# Patient Record
Sex: Male | Born: 1955 | Race: Black or African American | Hispanic: No | Marital: Single | Smoking: Former smoker
Health system: Southern US, Community
[De-identification: ages and names within clinical notes are randomized; demographics above are authoritative.]

## PROBLEM LIST (undated history)

## (undated) DIAGNOSIS — I639 Cerebral infarction, unspecified: Secondary | ICD-10-CM

## (undated) DIAGNOSIS — I10 Essential (primary) hypertension: Secondary | ICD-10-CM

## (undated) DIAGNOSIS — R4701 Aphasia: Secondary | ICD-10-CM

## (undated) DIAGNOSIS — R531 Weakness: Secondary | ICD-10-CM

## (undated) DIAGNOSIS — K117 Disturbances of salivary secretion: Secondary | ICD-10-CM

## (undated) DIAGNOSIS — N183 Chronic kidney disease, stage 3 unspecified: Secondary | ICD-10-CM

## (undated) DIAGNOSIS — F32A Depression, unspecified: Secondary | ICD-10-CM

## (undated) DIAGNOSIS — R569 Unspecified convulsions: Secondary | ICD-10-CM

## (undated) DIAGNOSIS — E785 Hyperlipidemia, unspecified: Secondary | ICD-10-CM

---

## 2013-07-06 DIAGNOSIS — I10 Essential (primary) hypertension: Secondary | ICD-10-CM | POA: Insufficient documentation

## 2013-11-07 DIAGNOSIS — I619 Nontraumatic intracerebral hemorrhage, unspecified: Secondary | ICD-10-CM | POA: Insufficient documentation

## 2014-05-19 DIAGNOSIS — I429 Cardiomyopathy, unspecified: Secondary | ICD-10-CM | POA: Insufficient documentation

## 2015-06-28 DIAGNOSIS — N1831 Chronic kidney disease, stage 3a: Secondary | ICD-10-CM | POA: Insufficient documentation

## 2015-06-28 DIAGNOSIS — N183 Chronic kidney disease, stage 3 unspecified: Secondary | ICD-10-CM | POA: Insufficient documentation

## 2016-05-29 DIAGNOSIS — F1021 Alcohol dependence, in remission: Secondary | ICD-10-CM | POA: Insufficient documentation

## 2017-06-03 DIAGNOSIS — M159 Polyosteoarthritis, unspecified: Secondary | ICD-10-CM | POA: Insufficient documentation

## 2018-04-05 ENCOUNTER — Emergency Department: Payer: Medicaid Other

## 2018-04-05 ENCOUNTER — Emergency Department
Admission: EM | Admit: 2018-04-05 | Discharge: 2018-04-06 | Disposition: A | Discharge status: Home / Self Care | Payer: Medicaid Other | Attending: Emergency Medicine | Admitting: Emergency Medicine

## 2018-04-05 ENCOUNTER — Other Ambulatory Visit: Payer: Self-pay

## 2018-04-05 ENCOUNTER — Encounter: Payer: Self-pay | Admitting: Emergency Medicine

## 2018-04-05 DIAGNOSIS — Z23 Encounter for immunization: Secondary | ICD-10-CM | POA: Insufficient documentation

## 2018-04-05 DIAGNOSIS — Y9301 Activity, walking, marching and hiking: Secondary | ICD-10-CM | POA: Diagnosis not present

## 2018-04-05 DIAGNOSIS — R270 Ataxia, unspecified: Secondary | ICD-10-CM | POA: Insufficient documentation

## 2018-04-05 DIAGNOSIS — Z7902 Long term (current) use of antithrombotics/antiplatelets: Secondary | ICD-10-CM | POA: Diagnosis not present

## 2018-04-05 DIAGNOSIS — I10 Essential (primary) hypertension: Secondary | ICD-10-CM | POA: Diagnosis not present

## 2018-04-05 DIAGNOSIS — Y998 Other external cause status: Secondary | ICD-10-CM | POA: Diagnosis not present

## 2018-04-05 DIAGNOSIS — F10920 Alcohol use, unspecified with intoxication, uncomplicated: Secondary | ICD-10-CM | POA: Insufficient documentation

## 2018-04-05 DIAGNOSIS — S0993XA Unspecified injury of face, initial encounter: Secondary | ICD-10-CM | POA: Diagnosis present

## 2018-04-05 DIAGNOSIS — S0181XA Laceration without foreign body of other part of head, initial encounter: Secondary | ICD-10-CM | POA: Diagnosis not present

## 2018-04-05 DIAGNOSIS — Z79899 Other long term (current) drug therapy: Secondary | ICD-10-CM | POA: Insufficient documentation

## 2018-04-05 DIAGNOSIS — Y929 Unspecified place or not applicable: Secondary | ICD-10-CM | POA: Diagnosis not present

## 2018-04-05 DIAGNOSIS — Z7982 Long term (current) use of aspirin: Secondary | ICD-10-CM | POA: Insufficient documentation

## 2018-04-05 DIAGNOSIS — Z87891 Personal history of nicotine dependence: Secondary | ICD-10-CM | POA: Insufficient documentation

## 2018-04-05 DIAGNOSIS — W01198A Fall on same level from slipping, tripping and stumbling with subsequent striking against other object, initial encounter: Secondary | ICD-10-CM | POA: Diagnosis not present

## 2018-04-05 HISTORY — DX: Essential (primary) hypertension: I10

## 2018-04-05 LAB — PROTIME-INR
INR: 0.96
PROTHROMBIN TIME: 12.7 s (ref 11.4–15.2)

## 2018-04-05 LAB — CBC WITH DIFFERENTIAL/PLATELET
BASOS PCT: 1 %
Basophils Absolute: 0 10*3/uL (ref 0–0.1)
Eosinophils Absolute: 0.1 10*3/uL (ref 0–0.7)
Eosinophils Relative: 1 %
HCT: 32.2 % — ABNORMAL LOW (ref 40.0–52.0)
Hemoglobin: 11 g/dL — ABNORMAL LOW (ref 13.0–18.0)
LYMPHS ABS: 1.3 10*3/uL (ref 1.0–3.6)
Lymphocytes Relative: 21 %
MCH: 34.4 pg — AB (ref 26.0–34.0)
MCHC: 34.1 g/dL (ref 32.0–36.0)
MCV: 100.6 fL — ABNORMAL HIGH (ref 80.0–100.0)
MONO ABS: 0.9 10*3/uL (ref 0.2–1.0)
MONOS PCT: 15 %
Neutro Abs: 3.7 10*3/uL (ref 1.4–6.5)
Neutrophils Relative %: 62 %
Platelets: 278 10*3/uL (ref 150–440)
RBC: 3.2 MIL/uL — ABNORMAL LOW (ref 4.40–5.90)
RDW: 13 % (ref 11.5–14.5)
WBC: 6 10*3/uL (ref 3.8–10.6)

## 2018-04-05 LAB — COMPREHENSIVE METABOLIC PANEL
ALBUMIN: 3.5 g/dL (ref 3.5–5.0)
ALK PHOS: 31 U/L — AB (ref 38–126)
ALT: 23 U/L (ref 17–63)
AST: 26 U/L (ref 15–41)
Anion gap: 13 (ref 5–15)
BUN: 36 mg/dL — AB (ref 6–20)
CALCIUM: 8.7 mg/dL — AB (ref 8.9–10.3)
CO2: 22 mmol/L (ref 22–32)
CREATININE: 2.6 mg/dL — AB (ref 0.61–1.24)
Chloride: 103 mmol/L (ref 101–111)
GFR calc Af Amer: 29 mL/min — ABNORMAL LOW (ref >60)
GFR calc non Af Amer: 25 mL/min — ABNORMAL LOW (ref >60)
GLUCOSE: 103 mg/dL — AB (ref 65–99)
Potassium: 3.6 mmol/L (ref 3.5–5.1)
Sodium: 138 mmol/L (ref 135–145)
Total Bilirubin: 0.5 mg/dL (ref 0.3–1.2)
Total Protein: 6.5 g/dL (ref 6.5–8.1)

## 2018-04-05 LAB — ETHANOL: Alcohol, Ethyl (B): 90 mg/dL — ABNORMAL HIGH (ref <10)

## 2018-04-05 MED ORDER — LIDOCAINE-EPINEPHRINE (PF) 1 %-1:200000 IJ SOLN
INTRAMUSCULAR | Status: AC
  Administered 2018-04-05: 30 mL
  Filled 2018-04-05: qty 30

## 2018-04-05 MED ORDER — BUPIVACAINE HCL (PF) 0.5 % IJ SOLN
INTRAMUSCULAR | Status: AC
  Filled 2018-04-05: qty 30

## 2018-04-05 MED ORDER — ACETAMINOPHEN 500 MG PO TABS
1000.0000 mg | ORAL_TABLET | Freq: Once | ORAL | Status: AC
  Administered 2018-04-05: 1000 mg via ORAL

## 2018-04-05 MED ORDER — ACETAMINOPHEN 500 MG PO TABS
ORAL_TABLET | ORAL | Status: AC
  Filled 2018-04-05: qty 2

## 2018-04-05 MED ORDER — BUPIVACAINE-EPINEPHRINE (PF) 0.5% -1:200000 IJ SOLN
30.0000 mL | Freq: Once | INTRAMUSCULAR | Status: AC
  Administered 2018-04-05: 30 mL

## 2018-04-05 MED ORDER — TETANUS-DIPHTH-ACELL PERTUSSIS 5-2.5-18.5 LF-MCG/0.5 IM SUSP
0.5000 mL | Freq: Once | INTRAMUSCULAR | Status: AC
  Administered 2018-04-05: 0.5 mL via INTRAMUSCULAR
  Filled 2018-04-05: qty 0.5

## 2018-04-05 NOTE — ED Provider Notes (Signed)
[facility] Emergency Department Provider Note  ____________________________________________   First MD Initiated Contact with Patient 04/05/18 2002     (approximate)  I have reviewed the triage vital signs and the nursing notes.   HISTORY  Chief Complaint Laceration   HPI Mark Shepard is a 62 y.o. male who comes to the emergency department via EMS after sustaining a complex facial laceration.  He was drinking alcohol this evening tripped and fell and landed on a ceramic plate injuring his left cheek and chin.  His last tetanus is unknown.  His pain was sudden onset and severe in his left face.  Nonradiating.  No numbness or weakness.  No chest pain or shortness of breath.  Past Medical History:  Diagnosis Date  . Hypertension     There are no active problems to display for this patient.   History reviewed. No pertinent surgical history.  Prior to Admission medications   Medication Sig Start Date End Date Taking? Authorizing Provider  amLODipine (NORVASC) 10 MG tablet Take 10 mg by mouth daily.   Yes [provider]  aspirin 325 MG EC tablet Take 325 mg by mouth daily.   Yes [provider]  carvedilol (COREG) 12.5 MG tablet Take 12.5 mg by mouth 2 (two) times daily with a meal.   Yes [provider]  chlorthalidone (HYGROTON) 25 MG tablet Take 25 mg by mouth daily.   Yes [provider]  docusate sodium (COLACE) 100 MG capsule Take 100 mg by mouth daily.   Yes [provider]  FLUoxetine (PROZAC) 10 MG capsule Take 30 mg by mouth daily.   Yes [provider]  gabapentin (NEURONTIN) 300 MG capsule Take 300 mg by mouth 3 (three) times daily.   Yes [provider]  hydrALAZINE (APRESOLINE) 25 MG tablet Take 25 mg by mouth 3 (three) times daily.   Yes [provider]  isosorbide mononitrate (IMDUR) 30 MG 24 hr tablet Take 30 mg by mouth daily.   Yes [provider]    lisinopril (PRINIVIL,ZESTRIL) 10 MG tablet Take 10 mg by mouth daily.   Yes [provider]  nitroGLYCERIN (NITRODUR - DOSED IN MG/24 HR) 0.4 mg/hr patch Place 0.4 mg onto the skin daily.   Yes [provider]  nitroGLYCERIN (NITROSTAT) 0.4 MG SL tablet Place 0.4 mg under the tongue every 5 (five) minutes as needed for chest pain.   Yes [provider]  pravastatin (PRAVACHOL) 40 MG tablet Take 40 mg by mouth daily.   Yes [provider]  traMADol (ULTRAM) 50 MG tablet Take 50 mg by mouth every 8 (eight) hours.   Yes [provider]  cephALEXin (KEFLEX) 500 MG capsule Take 1 capsule (500 mg total) by mouth 3 (three) times daily for 7 days. 04/06/18 04/13/18  Merrily Brittle, MD  HYDROcodone-acetaminophen (NORCO) 5-325 MG tablet Take 1 tablet by mouth every 6 (six) hours as needed for up to 7 doses for severe pain. 04/06/18   Merrily Brittle, MD    Allergies Patient has no known allergies.  No family history on file.  Social History Social History   Tobacco Use  . Smoking status: Former Games developer  . Smokeless tobacco: Never Used  Substance Use Topics  . Alcohol use: Yes    Alcohol/week: 4.8 oz    Types: 2 Cans of beer, 6 Shots of liquor per week    Comment: alot  . Drug use: Not Currently    Review of  Systems Constitutional: No fever/chills Eyes: No visual changes. ENT: No sore throat. Cardiovascular: Denies chest pain. Respiratory: Denies shortness of breath. Gastrointestinal: No abdominal pain.  No nausea, no vomiting.  No diarrhea.  No constipation. Genitourinary: Negative for dysuria. Musculoskeletal: Negative for back pain. Skin: Positive for wound Neurological: Negative for headaches, focal weakness or numbness.   ____________________________________________   PHYSICAL EXAM:  VITAL SIGNS: ED Triage Vitals  Enc Vitals Group     BP 04/05/18 2000 116/87     Pulse Rate 04/05/18 2000 81     Resp 04/05/18 2000 18     Temp  04/05/18 2000 97.9 F (36.6 C)     Temp Source 04/05/18 2000 Oral     SpO2 04/05/18 2000 98 %     Weight 04/05/18 1955 150 lb (68 kg)     Height 04/05/18 1955 6' (1.829 m)     Head Circumference --      Peak Flow --      Pain Score 04/05/18 1955 0     Pain Loc --      Pain Edu? --      Excl. in GC? --     Constitutional: Alcohol on his breath no acute distress Eyes: PERRL EOMI. midrange and brisk Head: Wounds noted as below. Nose: No congestion/rhinnorhea. Mouth/Throat: No trismus Neck: No stridor.   Cardiovascular: Normal rate, regular rhythm. Grossly normal heart sounds.  Good peripheral circulation. Respiratory: Normal respiratory effort.  No retractions. Lungs CTAB and moving good air Gastrointestinal: Soft nontender Musculoskeletal: No lower extremity edema   Neurologic:  Normal speech and language. No gross focal neurologic deficits are appreciated. Specifically facial nerve is intact Skin: 2 complex lacerations noted.  Left cheek has roughly 8 cm deep laceration with a small arterial bleeding. Second laceration on his left chin is more complex roughly 15 cm extremely deep and stellate Psychiatric: Mood and affect are normal. Speech and behavior are normal.    ____________________________________________   DIFFERENTIAL includes but not limited to  Intracerebral hemorrhage, laceration, facial nerve injury, cervical spine fracture ____________________________________________   LABS (all labs ordered are listed, but only abnormal results are displayed)  Labs Reviewed  COMPREHENSIVE METABOLIC PANEL - Abnormal; Notable for the following components:      Result Value   Glucose, Bld 103 (*)    BUN 36 (*)    Creatinine, Ser 2.60 (*)    Calcium 8.7 (*)    Alkaline Phosphatase 31 (*)    GFR calc non Af Amer 25 (*)    GFR calc Af Amer 29 (*)    All other components within normal limits  ETHANOL - Abnormal; Notable for the following components:   Alcohol, Ethyl (B) 90  (*)    All other components within normal limits  CBC WITH DIFFERENTIAL/PLATELET - Abnormal; Notable for the following components:   RBC 3.20 (*)    Hemoglobin 11.0 (*)    HCT 32.2 (*)    MCV 100.6 (*)    MCH 34.4 (*)    All other components within normal limits  PROTIME-INR    Lab work reviewed by me with only slightly elevated ethanol level __________________________________________  EKG   ____________________________________________  RADIOLOGY  CT head neck and face reviewed by me with no acute disease ____________________________________________   PROCEDURES  Procedure(s) performed: Yes  .Marland KitchenLaceration Repair Date/Time: 04/06/2018 12:40 AM Performed by: Merrily Brittle, MD Authorized by: Merrily Brittle, MD   Consent:    Consent obtained:  Verbal  Consent given by:  Patient   Risks discussed:  Infection, pain, retained foreign body, poor cosmetic result and poor wound healing Anesthesia (see MAR for exact dosages):    Anesthesia method:  Local infiltration   Local anesthetic:  Lidocaine 1% WITH epi and bupivacaine 0.5% w/o epi Laceration details:    Location:  Face   Face location:  L cheek   Length (cm):  9 Repair type:    Repair type:  Complex Pre-procedure details:    Preparation:  Patient was prepped and draped in usual sterile fashion and imaging obtained to evaluate for foreign bodies Exploration:    Limited defect created (wound extended): no     Hemostasis achieved with:  Direct pressure and tied off vessels   Wound exploration: entire depth of wound probed and visualized     Contaminated: no   Treatment:    Area cleansed with:  Saline   Amount of cleaning:  Extensive   Irrigation solution:  Sterile saline   Irrigation method:  Pressure wash   Visualized foreign bodies/material removed: no     Debridement:  Minimal   Undermining:  Minimal   Scar revision: no   Subcutaneous repair:    Suture size:  5-0   Suture material:  Vicryl   Suture  technique:  Simple interrupted   Number of sutures:  4 Skin repair:    Repair method:  Sutures   Suture size:  6-0   Suture material:  Nylon   Suture technique:  Simple interrupted   Number of sutures:  9 Approximation:    Approximation:  Close Post-procedure details:    Dressing:  Sterile dressing   Patient tolerance of procedure:  Tolerated well, no immediate complications Comments:     Patient's left cheek laceration was closed in 3 layers.  Initially he had arterial bleeding requiring emergent suturing with a total of 6 5-0 Vicryl absorbable sutures in figure-of-eight.  Eventually achieved hemostasis.  The subsequent wound was repaired into further layers with good cosmesis. Marland Kitchen.Laceration Repair Date/Time: 04/06/2018 12:41 AM Performed by: Merrily Brittle, MD Authorized by: Merrily Brittle, MD   Consent:    Consent obtained:  Verbal   Consent given by:  Patient   Risks discussed:  Infection, pain, retained foreign body, poor cosmetic result and poor wound healing Anesthesia (see MAR for exact dosages):    Anesthesia method:  Local infiltration   Local anesthetic:  Lidocaine 1% WITH epi and bupivacaine 0.5% w/o epi Laceration details:    Location:  Face   Face location:  Chin   Length (cm):  15 Repair type:    Repair type:  Complex Pre-procedure details:    Preparation:  Patient was prepped and draped in usual sterile fashion Exploration:    Limited defect created (wound extended): yes     Hemostasis achieved with:  Direct pressure and epinephrine   Wound exploration: entire depth of wound probed and visualized     Contaminated: no   Treatment:    Area cleansed with:  Saline   Amount of cleaning:  Extensive   Irrigation solution:  Sterile saline   Visualized foreign bodies/material removed: no     Debridement:  Moderate   Undermining:  Minimal   Scar revision: no   Mucous membrane repair:    Suture size:  5-0   Suture material:  Vicryl   Number of sutures:  7 Skin  repair:    Repair method:  Sutures   Suture size:  6-0  Suture material:  Nylon   Number of sutures:  25 Approximation:    Approximation:  Close Post-procedure details:    Dressing:  Sterile dressing   Patient tolerance of procedure:  Tolerated well, no immediate complications Comments:     This wound was jagged and extremely complex.  Wound was closed in multiple layers after extensive irrigation and debridement.  Did require some revision of the wound to facilitate adequate cosmesis    Critical Care performed: no  Observation: no ____________________________________________   INITIAL IMPRESSION / ASSESSMENT AND PLAN / ED COURSE  Pertinent labs & imaging results that were available during my care of the patient were reviewed by me and considered in my medical decision making (see chart for details).  The patient arrived hemodynamically stable although with significant facial trauma.  Most notably the laceration to his left chin had an arterial bleed.  I immediately used lidocaine with epinephrine to numb the wound and had to perform a total of 6 absorbable figure-of-eight sutures to achieve hemostasis.  Patient was then sent off to CT of his head neck face which are fortunately negative for acute fracture or bleed.  I then performed multiple complex laceration repairs in multiple layers achieving good cosmesis.  The wounds were extensively irrigated and I did debride and remove a fair amount of ceramic plate.  Given the dirty wounds and will cover him with antibiotics and give a 2-day wound check.  The patient's facial nerve is intact.  He is discharged home in improved condition verbalizes understanding and agreement with plan.      ____________________________________________   FINAL CLINICAL IMPRESSION(S) / ED DIAGNOSES  Final diagnoses:  Facial laceration, initial encounter      NEW MEDICATIONS STARTED DURING THIS VISIT:  New Prescriptions   CEPHALEXIN (KEFLEX) 500  MG CAPSULE    Take 1 capsule (500 mg total) by mouth 3 (three) times daily for 7 days.   HYDROCODONE-ACETAMINOPHEN (NORCO) 5-325 MG TABLET    Take 1 tablet by mouth every 6 (six) hours as needed for up to 7 doses for severe pain.     Note:  This document was prepared using Dragon voice recognition software and may include unintentional dictation errors.     Merrily Brittle, MD 04/06/18 [phone]

## 2018-04-05 NOTE — ED Notes (Signed)
Patient stated that now he is having chest pain. Sherri RN and Rifenbark MD notified. EKG performed by this EDT.

## 2018-04-05 NOTE — ED Notes (Signed)
Lacerations cleaned with normal saline flushed into the wounds. Bleeding noted from lac to the chin. Pt BP stable at this time.

## 2018-04-05 NOTE — ED Notes (Signed)
ED Provider at bedside to suture  

## 2018-04-05 NOTE — ED Notes (Signed)
Pt to the er for lacs to the face that occurred from a fall. ETOH on board. Pt does not remember what happened. Pt has a large lac to the left cheek and multiple lacs to the underside of the chin. Arterial bleeds noted on both the cheek and the chin. Pt is able to answer questions. Denies blood thinners but unsure of the medications he takes. Pt dropped a plate during the fall and that is what caused the lacerations.

## 2018-04-05 NOTE — ED Notes (Signed)
Family at bedside. 

## 2018-04-05 NOTE — ED Notes (Signed)
Pt reports chest pain a 7/10. Left side, asking for nitro. Family at bedside. MD aware.

## 2018-04-05 NOTE — ED Triage Notes (Signed)
Etoh, fell

## 2018-04-06 MED ORDER — CEPHALEXIN 500 MG PO CAPS
500.0000 mg | ORAL_CAPSULE | Freq: Once | ORAL | Status: AC
  Administered 2018-04-06: 500 mg via ORAL
  Filled 2018-04-06: qty 1

## 2018-04-06 MED ORDER — HYDROCODONE-ACETAMINOPHEN 5-325 MG PO TABS: 1.0000 | ORAL_TABLET | Freq: Four times a day (QID) | ORAL | 0 refills | Status: DC | PRN

## 2018-04-06 MED ORDER — CEPHALEXIN 500 MG PO CAPS: 500.0000 mg | ORAL_CAPSULE | Freq: Three times a day (TID) | ORAL | 0 refills | Status: AC

## 2018-04-06 NOTE — ED Notes (Signed)
Pt wound dressed  

## 2018-04-06 NOTE — Discharge Instructions (Signed)
Please keep your wound clean and dry and make sure you follow-up in 2 days for a recheck.  Your stitches need to come out in 7 days.  Return to the emergency department sooner for any concerns whatsoever.  It was a pleasure to take care of you today, and thank you for coming to our emergency department.  If you have any questions or concerns before leaving please ask the nurse to grab me and I'm more than happy to go through your aftercare instructions again.  If you were prescribed any opioid pain medication today such as Norco, Vicodin, Percocet, morphine, hydrocodone, or oxycodone please make sure you do not drive when you are taking this medication as it can alter your ability to drive safely.  If you have any concerns once you are home that you are not improving or are in fact getting worse before you can make it to your follow-up appointment, please do not hesitate to call [phone] and come back for further evaluation.  Merrily Brittle, MD  Results for orders placed or performed during the hospital encounter of 04/05/18  Comprehensive metabolic panel  Result Value Ref Range   Sodium 138 135 - 145 mmol/L   Potassium 3.6 3.5 - 5.1 mmol/L   Chloride 103 101 - 111 mmol/L   CO2 22 22 - 32 mmol/L   Glucose, Bld 103 (H) 65 - 99 mg/dL   BUN 36 (H) 6 - 20 mg/dL   Creatinine, Ser 9.60 (H) 0.61 - 1.24 mg/dL   Calcium 8.7 (L) 8.9 - 10.3 mg/dL   Total Protein 6.5 6.5 - 8.1 g/dL   Albumin 3.5 3.5 - 5.0 g/dL   AST 26 15 - 41 U/L   ALT 23 17 - 63 U/L   Alkaline Phosphatase 31 (L) 38 - 126 U/L   Total Bilirubin 0.5 0.3 - 1.2 mg/dL   GFR calc non Af Amer 25 (L) >60 mL/min   GFR calc Af Amer 29 (L) >60 mL/min   Anion gap 13 5 - 15  Ethanol  Result Value Ref Range   Alcohol, Ethyl (B) 90 (H) <10 mg/dL  CBC with Differential  Result Value Ref Range   WBC 6.0 3.8 - 10.6 K/uL   RBC 3.20 (L) 4.40 - 5.90 MIL/uL   Hemoglobin 11.0 (L) 13.0 - 18.0 g/dL   HCT 45.4 (L) 09.8 - 11.9 %   MCV 100.6 (H) 80.0 -  100.0 fL   MCH 34.4 (H) 26.0 - 34.0 pg   MCHC 34.1 32.0 - 36.0 g/dL   RDW 14.7 82.9 - 56.2 %   Platelets 278 150 - 440 K/uL   Neutrophils Relative % 62 %   Neutro Abs 3.7 1.4 - 6.5 K/uL   Lymphocytes Relative 21 %   Lymphs Abs 1.3 1.0 - 3.6 K/uL   Monocytes Relative 15 %   Monocytes Absolute 0.9 0.2 - 1.0 K/uL   Eosinophils Relative 1 %   Eosinophils Absolute 0.1 0 - 0.7 K/uL   Basophils Relative 1 %   Basophils Absolute 0.0 0 - 0.1 K/uL  Protime-INR  Result Value Ref Range   Prothrombin Time 12.7 11.4 - 15.2 seconds   INR 0.96    Ct Head Wo Contrast  Result Date: 04/05/2018 CLINICAL DATA:  62 y/o  M; fall, ETOH, ataxia, deep cuts to face. EXAM: CT HEAD WITHOUT CONTRAST CT MAXILLOFACIAL WITHOUT CONTRAST CT CERVICAL SPINE WITHOUT CONTRAST TECHNIQUE: Multidetector CT imaging of the head, cervical spine, and maxillofacial structures were  performed using the standard protocol without intravenous contrast. Multiplanar CT image reconstructions of the cervical spine and maxillofacial structures were also generated. COMPARISON:  None. FINDINGS: CT HEAD FINDINGS Brain: No evidence of acute infarction, hemorrhage, hydrocephalus, extra-axial collection or mass lesion/mass effect. 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 Nonspecific foci of hypoattenuation in subcortical and periventricular white matter are compatible with moderate chronic microvascular ischemic changes for age. Moderate brain parenchymal volume loss. Partially empty sella turcica. Vascular: Mild calcific atherosclerosis of carotid siphons. Skull: Normal. Negative for fracture or focal lesion. Other: Partial opacification of external auditory canals, likely cerumen. CT MAXILLOFACIAL FINDINGS Osseous: No fracture or mandibular dislocation. No destructive process. Orbits: Negative. No traumatic or inflammatory finding. Sinuses: Clear. Soft tissues: Large left facial lacerations  and multiple areas of superficial facial soft tissue contusion. No large hematoma. CT CERVICAL SPINE FINDINGS Alignment: Straightening of cervical lordosis. C7-T1 grade 1 anterolisthesis. Skull base and vertebrae: No acute fracture. No primary bone lesion or focal pathologic process. Soft tissues and spinal canal: No prevertebral fluid or swelling. No visible canal hematoma. Disc levels: Moderate cervical spondylosis with multilevel disc and facet degenerative changes. Right-sided uncovertebral and facet hypertrophy with bony foraminal stenosis at the C4-T1 levels and left-sided uncovertebral and facet hypertrophy with bony foraminal stenosis at C2-3 as well as C5-T1. No high-grade bony canal stenosis. Upper chest: Negative. Other: Moderate calcific atherosclerosis of carotid bifurcations. IMPRESSION: CT head: 1. No acute intracranial abnormality or calvarial fracture. 2. Moderate chronic microvascular ischemic changes and parenchymal volume loss of the brain for age. CT maxillofacial: 1. Large left facial lacerations in multiple areas of superficial facial soft tissue contusion. No large hematoma. 2. No acute facial fracture or mandibular dislocation. CT cervical spine: 1. No acute fracture or malalignment. 2. Moderate cervical spine spondylosis greatest at the C5-T1 levels. 3. Calcific atherosclerosis of carotid bifurcations. Electronically Signed   By: Mitzi Hansen M.D.   On: 04/05/2018 21:02   Ct Cervical Spine Wo Contrast  Result Date: 04/05/2018 CLINICAL DATA:  62 y/o  M; fall, ETOH, ataxia, deep cuts to face. EXAM: CT HEAD WITHOUT CONTRAST CT MAXILLOFACIAL WITHOUT CONTRAST CT CERVICAL SPINE WITHOUT CONTRAST TECHNIQUE: Multidetector CT imaging of the head, cervical spine, and maxillofacial structures were performed using the standard protocol without intravenous contrast. Multiplanar CT image reconstructions of the cervical spine and maxillofacial structures were also generated. COMPARISON:   None. FINDINGS: CT HEAD FINDINGS Brain: No evidence of acute infarction, hemorrhage, hydrocephalus, extra-axial collection or mass lesion/mass effect. 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 Nonspecific foci of hypoattenuation in subcortical and periventricular white matter are compatible with moderate chronic microvascular ischemic changes for age. Moderate brain parenchymal volume loss. Partially empty sella turcica. Vascular: Mild calcific atherosclerosis of carotid siphons. Skull: Normal. Negative for fracture or focal lesion. Other: Partial opacification of external auditory canals, likely cerumen. CT MAXILLOFACIAL FINDINGS Osseous: No fracture or mandibular dislocation. No destructive process. Orbits: Negative. No traumatic or inflammatory finding. Sinuses: Clear. Soft tissues: Large left facial lacerations and multiple areas of superficial facial soft tissue contusion. No large hematoma. CT CERVICAL SPINE FINDINGS Alignment: Straightening of cervical lordosis. C7-T1 grade 1 anterolisthesis. Skull base and vertebrae: No acute fracture. No primary bone lesion or focal pathologic process. Soft tissues and spinal canal: No prevertebral fluid or swelling. No visible canal hematoma. Disc levels: Moderate cervical spondylosis with multilevel disc and facet degenerative changes. Right-sided uncovertebral  and facet hypertrophy with bony foraminal stenosis at the C4-T1 levels and left-sided uncovertebral and facet hypertrophy with bony foraminal stenosis at C2-3 as well as C5-T1. No high-grade bony canal stenosis. Upper chest: Negative. Other: Moderate calcific atherosclerosis of carotid bifurcations. IMPRESSION: CT head: 1. No acute intracranial abnormality or calvarial fracture. 2. Moderate chronic microvascular ischemic changes and parenchymal volume loss of the brain for age. CT maxillofacial: 1. Large left facial lacerations in multiple areas of  superficial facial soft tissue contusion. No large hematoma. 2. No acute facial fracture or mandibular dislocation. CT cervical spine: 1. No acute fracture or malalignment. 2. Moderate cervical spine spondylosis greatest at the C5-T1 levels. 3. Calcific atherosclerosis of carotid bifurcations. Electronically Signed   By: Mitzi Hansen M.D.   On: 04/05/2018 21:02   Ct Maxillofacial Wo Cm  Result Date: 04/05/2018 CLINICAL DATA:  62 y/o  M; fall, ETOH, ataxia, deep cuts to face. EXAM: CT HEAD WITHOUT CONTRAST CT MAXILLOFACIAL WITHOUT CONTRAST CT CERVICAL SPINE WITHOUT CONTRAST TECHNIQUE: Multidetector CT imaging of the head, cervical spine, and maxillofacial structures were performed using the standard protocol without intravenous contrast. Multiplanar CT image reconstructions of the cervical spine and maxillofacial structures were also generated. COMPARISON:  None. FINDINGS: CT HEAD FINDINGS Brain: No evidence of acute infarction, hemorrhage, hydrocephalus, extra-axial collection or mass lesion/mass effect. 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 Nonspecific foci of hypoattenuation in subcortical and periventricular white matter are compatible with moderate chronic microvascular ischemic changes for age. Moderate brain parenchymal volume loss. Partially empty sella turcica. Vascular: Mild calcific atherosclerosis of carotid siphons. Skull: Normal. Negative for fracture or focal lesion. Other: Partial opacification of external auditory canals, likely cerumen. CT MAXILLOFACIAL FINDINGS Osseous: No fracture or mandibular dislocation. No destructive process. Orbits: Negative. No traumatic or inflammatory finding. Sinuses: Clear. Soft tissues: Large left facial lacerations and multiple areas of superficial facial soft tissue contusion. No large hematoma. CT CERVICAL SPINE FINDINGS Alignment: Straightening of cervical lordosis. C7-T1 grade 1  anterolisthesis. Skull base and vertebrae: No acute fracture. No primary bone lesion or focal pathologic process. Soft tissues and spinal canal: No prevertebral fluid or swelling. No visible canal hematoma. Disc levels: Moderate cervical spondylosis with multilevel disc and facet degenerative changes. Right-sided uncovertebral and facet hypertrophy with bony foraminal stenosis at the C4-T1 levels and left-sided uncovertebral and facet hypertrophy with bony foraminal stenosis at C2-3 as well as C5-T1. No high-grade bony canal stenosis. Upper chest: Negative. Other: Moderate calcific atherosclerosis of carotid bifurcations. IMPRESSION: CT head: 1. No acute intracranial abnormality or calvarial fracture. 2. Moderate chronic microvascular ischemic changes and parenchymal volume loss of the brain for age. CT maxillofacial: 1. Large left facial lacerations in multiple areas of superficial facial soft tissue contusion. No large hematoma. 2. No acute facial fracture or mandibular dislocation. CT cervical spine: 1. No acute fracture or malalignment. 2. Moderate cervical spine spondylosis greatest at the C5-T1 levels. 3. Calcific atherosclerosis of carotid bifurcations. Electronically Signed   By: Mitzi Hansen M.D.   On: 04/05/2018 21:02

## 2019-05-09 DIAGNOSIS — G959 Disease of spinal cord, unspecified: Secondary | ICD-10-CM | POA: Insufficient documentation

## 2019-10-03 DIAGNOSIS — M545 Low back pain, unspecified: Secondary | ICD-10-CM | POA: Insufficient documentation

## 2020-11-23 DIAGNOSIS — Z0000 Encounter for general adult medical examination without abnormal findings: Secondary | ICD-10-CM | POA: Insufficient documentation

## 2020-11-23 DIAGNOSIS — M5416 Radiculopathy, lumbar region: Secondary | ICD-10-CM | POA: Insufficient documentation

## 2021-06-11 DIAGNOSIS — I639 Cerebral infarction, unspecified: Secondary | ICD-10-CM | POA: Insufficient documentation

## 2021-09-13 ENCOUNTER — Encounter (INDEPENDENT_AMBULATORY_CARE_PROVIDER_SITE_OTHER): Payer: Self-pay

## 2021-09-13 ENCOUNTER — Ambulatory Visit: Payer: Medicare Other | Admitting: Podiatry

## 2021-09-13 ENCOUNTER — Other Ambulatory Visit: Payer: Self-pay

## 2021-09-14 ENCOUNTER — Other Ambulatory Visit: Payer: Self-pay

## 2021-09-14 ENCOUNTER — Emergency Department: Payer: Medicare Other

## 2021-09-14 ENCOUNTER — Emergency Department
Admission: EM | Admit: 2021-09-14 | Discharge: 2021-09-14 | Disposition: A | Discharge status: Other Acute Inpatient Hospital | Payer: Medicare Other | Attending: Emergency Medicine | Admitting: Emergency Medicine

## 2021-09-14 DIAGNOSIS — Z79899 Other long term (current) drug therapy: Secondary | ICD-10-CM | POA: Insufficient documentation

## 2021-09-14 DIAGNOSIS — R569 Unspecified convulsions: Secondary | ICD-10-CM | POA: Insufficient documentation

## 2021-09-14 DIAGNOSIS — Z20822 Contact with and (suspected) exposure to covid-19: Secondary | ICD-10-CM | POA: Insufficient documentation

## 2021-09-14 DIAGNOSIS — I619 Nontraumatic intracerebral hemorrhage, unspecified: Secondary | ICD-10-CM | POA: Insufficient documentation

## 2021-09-14 DIAGNOSIS — Z87891 Personal history of nicotine dependence: Secondary | ICD-10-CM | POA: Insufficient documentation

## 2021-09-14 DIAGNOSIS — N183 Chronic kidney disease, stage 3 unspecified: Secondary | ICD-10-CM | POA: Diagnosis not present

## 2021-09-14 DIAGNOSIS — I129 Hypertensive chronic kidney disease with stage 1 through stage 4 chronic kidney disease, or unspecified chronic kidney disease: Secondary | ICD-10-CM | POA: Diagnosis not present

## 2021-09-14 DIAGNOSIS — Z7982 Long term (current) use of aspirin: Secondary | ICD-10-CM | POA: Insufficient documentation

## 2021-09-14 DIAGNOSIS — I611 Nontraumatic intracerebral hemorrhage in hemisphere, cortical: Secondary | ICD-10-CM | POA: Diagnosis not present

## 2021-09-14 LAB — CBC WITH DIFFERENTIAL/PLATELET
Abs Immature Granulocytes: 0.03 10*3/uL (ref 0.00–0.07)
Basophils Absolute: 0 10*3/uL (ref 0.0–0.1)
Basophils Relative: 0 %
Eosinophils Absolute: 0.1 10*3/uL (ref 0.0–0.5)
Eosinophils Relative: 2 %
HCT: 36.9 % — ABNORMAL LOW (ref 39.0–52.0)
Hemoglobin: 12.9 g/dL — ABNORMAL LOW (ref 13.0–17.0)
Immature Granulocytes: 0 %
Lymphocytes Relative: 15 %
Lymphs Abs: 1.2 10*3/uL (ref 0.7–4.0)
MCH: 31.4 pg (ref 26.0–34.0)
MCHC: 35 g/dL (ref 30.0–36.0)
MCV: 89.8 fL (ref 80.0–100.0)
Monocytes Absolute: 0.4 10*3/uL (ref 0.1–1.0)
Monocytes Relative: 5 %
Neutro Abs: 6.3 10*3/uL (ref 1.7–7.7)
Neutrophils Relative %: 78 %
Platelets: 319 10*3/uL (ref 150–400)
RBC: 4.11 MIL/uL — ABNORMAL LOW (ref 4.22–5.81)
RDW: 13 % (ref 11.5–15.5)
WBC: 8 10*3/uL (ref 4.0–10.5)
nRBC: 0 % (ref 0.0–0.2)

## 2021-09-14 LAB — PROTIME-INR
INR: 1.1 (ref 0.8–1.2)
Prothrombin Time: 13.7 seconds (ref 11.4–15.2)

## 2021-09-14 LAB — LIPID PANEL
Cholesterol: 106 mg/dL (ref 0–200)
HDL: 54 mg/dL (ref >40)
LDL Cholesterol: 41 mg/dL (ref 0–99)
Total CHOL/HDL Ratio: 2 RATIO
Triglycerides: 57 mg/dL (ref <150)
VLDL: 11 mg/dL (ref 0–40)

## 2021-09-14 LAB — BASIC METABOLIC PANEL
Anion gap: 7 (ref 5–15)
BUN: 19 mg/dL (ref 8–23)
CO2: 27 mmol/L (ref 22–32)
Calcium: 9.5 mg/dL (ref 8.9–10.3)
Chloride: 106 mmol/L (ref 98–111)
Creatinine, Ser: 1.24 mg/dL (ref 0.61–1.24)
GFR, Estimated: >60 mL/min (ref >60)
Glucose, Bld: 119 mg/dL — ABNORMAL HIGH (ref 70–99)
Potassium: 3.8 mmol/L (ref 3.5–5.1)
Sodium: 140 mmol/L (ref 135–145)

## 2021-09-14 LAB — RESP PANEL BY RT-PCR (FLU A&B, COVID) ARPGX2
Influenza A by PCR: NEGATIVE
Influenza B by PCR: NEGATIVE
SARS Coronavirus 2 by RT PCR: NEGATIVE

## 2021-09-14 MED ORDER — PANTOPRAZOLE SODIUM 40 MG IV SOLR: 40.0000 mg | Freq: Every day | INTRAVENOUS | Status: DC

## 2021-09-14 MED ORDER — LABETALOL HCL 5 MG/ML IV SOLN
20.0000 mg | Freq: Once | INTRAVENOUS | Status: AC
  Administered 2021-09-14: 20 mg via INTRAVENOUS
  Filled 2021-09-14: qty 4

## 2021-09-14 MED ORDER — SENNOSIDES-DOCUSATE SODIUM 8.6-50 MG PO TABS: 1.0000 | ORAL_TABLET | Freq: Two times a day (BID) | ORAL | Status: DC

## 2021-09-14 MED ORDER — ACETAMINOPHEN 325 MG RE SUPP: 650.0000 mg | RECTAL | Status: DC | PRN

## 2021-09-14 MED ORDER — ACETAMINOPHEN 160 MG/5ML PO SOLN
650.0000 mg | ORAL | Status: DC | PRN
  Filled 2021-09-14: qty 20.3

## 2021-09-14 MED ORDER — CLEVIDIPINE BUTYRATE 0.5 MG/ML IV EMUL
0.0000 mg/h | INTRAVENOUS | Status: DC
Start: 2021-09-14 — End: 2021-09-15
  Administered 2021-09-14: 1 mg/h via INTRAVENOUS
  Filled 2021-09-14: qty 50

## 2021-09-14 MED ORDER — LEVETIRACETAM IN NACL 1500 MG/100ML IV SOLN
1500.0000 mg | Freq: Once | INTRAVENOUS | Status: AC
  Administered 2021-09-14: 1500 mg via INTRAVENOUS
  Filled 2021-09-14: qty 100

## 2021-09-14 MED ORDER — IOHEXOL 350 MG/ML SOLN
75.0000 mL | Freq: Once | INTRAVENOUS | Status: AC | PRN
  Administered 2021-09-14: 75 mL via INTRAVENOUS

## 2021-09-14 MED ORDER — ACETAMINOPHEN 325 MG PO TABS: 650.0000 mg | ORAL_TABLET | ORAL | Status: DC | PRN

## 2021-09-14 MED ORDER — STROKE: EARLY STAGES OF RECOVERY BOOK: Freq: Once | Status: DC

## 2021-09-14 NOTE — ED Provider Notes (Signed)
[facility] Emergency Department Provider Note  ____________________________________________   I have reviewed the triage vital signs and the nursing notes.   HISTORY  Chief Complaint Seizures   History limited by and level 5 caveat due to: Aphagia  HPI Mark Shepard is a 65 y.o. male who presents to the emergency department today because of concern for witnessed seizure that occurred at his living facility. 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   Records reviewed. Per medical record review patient has a history of acute ischemic stroke this year as well as ICH in 2014.   Past Medical History:  Diagnosis Date   Hypertension     Patient Active Problem List   Diagnosis Date Noted   Acute ischemic stroke (HCC) 06/11/2021   Encounter for medical examination to establish care 11/23/2020   Lumbar radiculopathy 11/23/2020   Chronic midline low back pain without sciatica 10/03/2019   Cervical myelopathy (HCC) 05/09/2019   Primary osteoarthritis involving multiple joints 06/03/2017   Alcohol use disorder, severe, in early remission (HCC) 05/29/2016   Chronic kidney disease, stage 3 (HCC) 06/28/2015   Cardiomyopathy (HCC) 05/19/2014   ICH (intracerebral hemorrhage) (HCC) 11/07/2013   Hypertension 07/06/2013    History reviewed. No pertinent surgical history.  Prior to Admission medications   Medication Sig Start Date End Date Taking? Authorizing Provider  acetaminophen (TYLENOL) 160 MG/5ML solution 20.3 mL (650 mg total) by Enteral tube: gastric  route every four (4) hours as needed ((1-3) according to the Numeric Rating Scale (NAPS) or PAINAD score OR (1-2) according to CPOT). 06/20/21   [provider]  amLODipine (NORVASC) 10 MG tablet Take 10 mg by mouth daily.    [provider]  amLODipine (NORVASC) 10 MG tablet 1 tablet (10 mg total) by G-tube route in the morning. 06/20/21 06/20/22   [provider]  aspirin 325 MG EC tablet Take 325 mg by mouth daily.    [provider]  aspirin 81 MG chewable tablet 1 tablet (81 mg total) by G-tube route in the morning. 06/20/21   [provider]  atorvastatin (LIPITOR) 20 MG tablet Take 20 mg by mouth daily. 04/10/21   [provider]  atorvastatin (LIPITOR) 80 MG tablet Take 80 mg by mouth daily. 09/03/21   [provider]  Carboxymethylcellulose Sodium 0.25 % SOLN Administer 2 drops to both eyes four (4) times a day as needed. 06/20/21   [provider]  carvedilol (COREG) 12.5 MG tablet Take 12.5 mg by mouth 2 (two) times daily with a meal.    [provider]  chlorthalidone (HYGROTON) 25 MG tablet Take 25 mg by mouth daily.    [provider]  diclofenac Sodium (VOLTAREN) 1 % GEL Apply topically. 06/20/21 06/20/22  [provider]  docusate sodium (COLACE) 100 MG capsule Take 100 mg by mouth daily.    [provider]  doxycycline (VIBRAMYCIN) 100 MG capsule Take 100 mg by mouth 2 (two) times daily. 08/22/21   [provider]  FLUoxetine (PROZAC) 10 MG capsule Take 30 mg by mouth daily.    [provider]  FLUoxetine (PROZAC) 40 MG capsule Take 40 mg by mouth daily as needed. 09/03/21   [provider]  gabapentin (NEURONTIN) 100 MG capsule Take by mouth. 08/26/21   [provider]  gabapentin (NEURONTIN) 300 MG capsule Take 300 mg by mouth 3 (three) times daily.    [provider]  glycopyrrolate (ROBINUL) 1 MG tablet Take by mouth. 09/03/21   [provider]  hydrALAZINE (APRESOLINE) 25 MG tablet Take 25 mg by mouth 3 (three) times daily.    [provider]  HYDROcodone-acetaminophen (NORCO) 5-325 MG tablet Take 1 tablet by mouth every 6 (six) hours as needed for up to 7 doses for severe pain. 04/06/18   Merrily Brittle, MD  isosorbide mononitrate (IMDUR) 30 MG 24 hr tablet Take 30 mg by mouth  daily.    [provider]  lidocaine (XYLOCAINE) 2 % solution 15 mL by Mouth route every four (4) hours as needed (multiple lip ulcers, both right/left upper lip). 06/20/21   [provider]  lisinopril (PRINIVIL,ZESTRIL) 10 MG tablet Take 10 mg by mouth daily.    [provider]  lisinopril (ZESTRIL) 20 MG tablet Take by mouth.    [provider]  Multiple Vitamins-Minerals (THERA-M) TABS 1 tablet by G-tube route in the morning 06/20/21   [provider]  mupirocin ointment (BACTROBAN) 2 % SMARTSIG:1 Application Topical 2-3 Times Daily 08/30/21   [provider]  nitroGLYCERIN (NITRODUR - DOSED IN MG/24 HR) 0.4 mg/hr patch Place 0.4 mg onto the skin daily.    [provider]  nitroGLYCERIN (NITROSTAT) 0.4 MG SL tablet Place 0.4 mg under the tongue every 5 (five) minutes as needed for chest pain.    [provider]  nystatin cream (MYCOSTATIN) Apply 1 application topically 2 (two) times daily. 08/21/21   [provider]  pravastatin (PRAVACHOL) 40 MG tablet Take 40 mg by mouth daily.    [provider]  scopolamine (TRANSDERM-SCOP) 1 MG/3DAYS 1 patch every 3 (three) days. 08/14/21   [provider]  thiamine 100 MG tablet 1 tablet (100 mg total) by Enteral tube: gastric  route in the morning. 06/20/21   [provider]  tiZANidine (ZANAFLEX) 2 MG tablet Take 2 mg by mouth 3 (three) times daily. 09/06/21   [provider]  traMADol (ULTRAM) 50 MG tablet Take 50 mg by mouth every 8 (eight) hours.    [provider]  Vitamin D, Ergocalciferol, (DRISDOL) 1.25 MG (50000 UNIT) CAPS capsule SMARTSIG:1 Capsule(s) By Mouth 04/09/21   [provider]    Allergies Patient has no known allergies.  No family history on file.  Social History Social History   Tobacco Use   Smoking status: Former   Smokeless tobacco: Never  Substance Use Topics   Alcohol use: Yes    Alcohol/week:  8.0 standard drinks    Types: 2 Cans of beer, 6 Shots of liquor per week    Comment: alot   Drug use: Not Currently    Review of Systems Unable to obtain reliable ROS secondary to aphagia.   ____________________________________________   PHYSICAL EXAM:  VITAL SIGNS: ED Triage Vitals  Enc Vitals Group     BP 09/14/21 1424 (!) 140/93     Pulse Rate 09/14/21 1424 68     Resp --      Temp 09/14/21 1424 98.1 F (36.7 C)     Temp Source 09/14/21 1424 Axillary     SpO2 09/14/21 1424 96 %     Weight 09/14/21 1426 142 lb 3.2 oz (64.5 kg)   Constitutional: Awake, alert.  Eyes: Conjunctivae are normal.  ENT      Head: Normocephalic and atraumatic.      Nose: No congestion/rhinnorhea.      Mouth/Throat: Mucous membranes are moist.      Neck:  No stridor. Hematological/Lymphatic/Immunilogical: No cervical lymphadenopathy. Cardiovascular: Normal rate, regular rhythm.  No murmurs, rubs, or gallops.  Respiratory: Normal respiratory effort without tachypnea nor retractions. Breath sounds are clear and equal bilaterally. No wheezes/rales/rhonchi. Gastrointestinal: Soft and non tender. No rebound. No guarding.  Genitourinary: Deferred Musculoskeletal: Normal range of motion in all extremities. No lower extremity edema. Neurologic:  Awake, alert. Able to nod yes or no. Follows commands. Moves all extremities.  Skin:  Skin is warm, dry and intact. No rash noted.  ____________________________________________    LABS (pertinent positives/negatives)  BMP wnl except glu 119 CBC wbc 8.0, hgb 12.9, plt 319  ____________________________________________   EKG  I, Phineas Semen, attending physician, personally viewed and interpreted this EKG  EKG Time: 1422 Rate: 68 Rhythm: sinus rhythm Axis: normal Intervals: qtc 450 QRS: narrow ST changes: no st elevation Impression: normal ekg  ____________________________________________    RADIOLOGY  CT head Acute parenchymal  hemorrhage.   CT cervical spine No acute abnormality  ____________________________________________   PROCEDURES  Procedures  CRITICAL CARE Performed by: Phineas Semen   Total critical care time: 30 minutes  Critical care time was exclusive of separately billable procedures and treating other patients.  Critical care was necessary to treat or prevent imminent or life-threatening deterioration.  Critical care was time spent personally by me on the following activities: development of treatment plan with patient and/or surrogate as well as nursing, discussions with consultants, evaluation of patient's response to treatment, examination of patient, obtaining history from patient or surrogate, ordering and performing treatments and interventions, ordering and review of laboratory studies, ordering and review of radiographic studies, pulse oximetry and re-evaluation of patient's condition.  ____________________________________________   INITIAL IMPRESSION / ASSESSMENT AND PLAN / ED COURSE  Pertinent labs & imaging results that were available during my care of the patient were reviewed by me and considered in my medical decision making (see chart for details).  Patient presented to the emergency department today after a witnessed seizure at his living facility.  Patient does have a history of ischemic stroke which is left him with profound aphasia.  Time my exam patient is awake and alert.  He is able to follow commands and is moving all extremities.  Is able to nod yes or no.  CT head does show a intraparenchymal bleed.  Dr. Jerrell Belfast with neurology did evaluate the patient.  Did feel patient would benefit from tertiary care.  Patient requested transfer to [facility] given previous care at [facility].  Did discuss with [facility] neurologist who accepted patient in transfer.  ____________________________________________   FINAL CLINICAL IMPRESSION(S) / ED DIAGNOSES  Final diagnoses:  Seizure (HCC)   Intraparenchymal hemorrhage of brain [facility])     Note: This dictation was prepared with Dragon dictation. Any transcriptional errors that result from this process are unintentional     Phineas Semen, MD 09/14/21 1711

## 2021-09-14 NOTE — ED Notes (Addendum)
Patient will not cooperate. Prefers to lay on his left side with both arms bent under his chin. Patient was cleaned up after urinating on self. Now is pulling adult diaper off. Unable to obtain blood pressure due to constant movement and positioning.

## 2021-09-14 NOTE — ED Notes (Signed)
Called [facility]) for transport to [facility] ICU 2741. Per [facility] , short one truck will put on transport list.

## 2021-09-14 NOTE — ED Notes (Signed)
Chart was checked for emtala completion prior to pt leaving.

## 2021-09-14 NOTE — ED Notes (Signed)
Neuro ICU [address] [facility]

## 2021-09-14 NOTE — ED Notes (Signed)
Called [facility] for transfer 1600

## 2021-09-14 NOTE — ED Triage Notes (Signed)
Patient with history of CVA, nonverbal at baseline, had a seizure witnessed by staff at nursing home. Patient was given 2 mg IV Versed by EMS.

## 2021-09-14 NOTE — ED Notes (Signed)
Accepted to [facility]  waiting  bed assignment

## 2021-09-14 NOTE — Consult Note (Addendum)
Neurology Consultation  Reason for Consult: Intracerebral hemorrhage, seizure Referring Physician: Dr. Derrill Kay  CC: Seizure  History is obtained from: Chart, patient's emergency contact friend-Ms. Florez  HPI: Mark Shepard is a 65 y.o. male past medical history of hypertension, left hemispheric stroke with residual aphasia, resident of a nursing facility, was noted to have new onset seizure At the facility and was brought into the hospital for emergent evaluation. Patient is unable to provide any history.  Spoke with the patient's emergency contact reports that he had been aphasic-completely nonverbal after his stroke although he does follow some commands. Initially on arrival, he was drowsy and likely postictal but his mentation has improved since. He was loaded with Keppra 1500 mg in the emergency room. Noncontrasted CT was done that revealed an acute hemorrhage in the left parietal-occipital region measuring 3.3 x 2.7 x 2.3 cm with mild surrounding edema and mass-effect and partial effacement of the posterior left lateral ventricle with no midline shift.  There is mild extension of the hemorrhage into the left subdural space along the left aspect of the posterior falx and the tentorium. Neurology was consulted for further management The patient's family/emergency contact requests transfer to [facility] for continuity of care and their preference.  LKW: Unclear tpa given?: no, ICH Premorbid modified Rankin scale (mRS): 4   ROS: Unable to obtain due to altered mental status.   Past Medical History:  Diagnosis Date   Hypertension    No family history on file.  Social History:   reports that he has quit smoking. He has never used smokeless tobacco. He reports current alcohol use of about 8.0 standard drinks per week. He reports that he does not currently use drugs.  Medications  Current Facility-Administered Medications:    levETIRAcetam (KEPPRA) IVPB 1500 mg/ 100 mL premix, 1,500 mg,  Intravenous, Once, Dionne Bucy, MD, Last Rate: 400 mL/hr at 09/14/21 1535, 1,500 mg at 09/14/21 1535  Current Outpatient Medications:    acetaminophen (TYLENOL) 325 MG tablet, Take 650 mg by mouth every 4 (four) hours as needed for mild pain or moderate pain., Disp: , Rfl:    amLODipine (NORVASC) 10 MG tablet, Take 10 mg by mouth daily., Disp: , Rfl:    aspirin 81 MG EC tablet, Take 81 mg by mouth daily., Disp: , Rfl:    atorvastatin (LIPITOR) 80 MG tablet, Take 80 mg by mouth every evening., Disp: , Rfl:    Carboxymethylcellulose Sodium (THERATEARS) 0.25 % SOLN, Place 2 drops into both eyes 4 (four) times daily as needed (dry eyes)., Disp: , Rfl:    carvedilol (COREG) 12.5 MG tablet, Take 12.5 mg by mouth 2 (two) times daily with a meal., Disp: , Rfl:    diclofenac Sodium (VOLTAREN) 1 % GEL, Apply 2 g topically 4 (four) times daily. (Apply to right shoulder), Disp: , Rfl:    FLUoxetine (PROZAC) 40 MG capsule, Take 40 mg by mouth daily., Disp: , Rfl:    gabapentin (NEURONTIN) 100 MG capsule, Take 100 mg by mouth 2 (two) times daily., Disp: , Rfl:    glycopyrrolate (ROBINUL) 1 MG tablet, Take 1 mg by mouth 3 (three) times daily., Disp: , Rfl:    lidocaine (LIDODERM) 5 %, Place 2 patches onto the skin daily. Remove & Discard patch within 12 hours or as directed by MD, Disp: , Rfl:    lisinopril (ZESTRIL) 20 MG tablet, Take 20 mg by mouth daily., Disp: , Rfl:    Multiple Vitamins-Minerals (THERA-M) TABS, Take 1 tablet  by mouth daily., Disp: , Rfl:    nitroGLYCERIN (NITROSTAT) 0.4 MG SL tablet, Place 0.4 mg under the tongue every 5 (five) minutes as needed for chest pain., Disp: , Rfl:    polyethylene glycol (MIRALAX / GLYCOLAX) 17 g packet, Take 17 g by mouth daily., Disp: , Rfl:    scopolamine (TRANSDERM-SCOP) 1 MG/3DAYS, Place 1 patch onto the skin every 3 (three) days., Disp: , Rfl:    senna (SENOKOT) 8.6 MG TABS tablet, Take 2 tablets by mouth at bedtime., Disp: , Rfl:    thiamine 100 MG  tablet, Take 100 mg by mouth daily., Disp: , Rfl:    tiZANidine (ZANAFLEX) 2 MG tablet, Take 2 mg by mouth every 8 (eight) hours., Disp: , Rfl:    Vitamin D, Ergocalciferol, (DRISDOL) 1.25 MG (50000 UNIT) CAPS capsule, Take 50,000 Units by mouth every 30 (thirty) days. (First of the month), Disp: , Rfl:   Exam: Current vital signs: BP (!) 174/124   Pulse 94   Temp 98.1 F (36.7 C) (Axillary)   Resp 15   Wt 64.5 kg   SpO2 99%   BMI 19.29 kg/m  Vital signs in last 24 hours: Temp:  [98.1 F (36.7 C)] 98.1 F (36.7 C) (10/08 1424) Pulse Rate:  [68-94] 94 (10/08 1536) Resp:  [15] 15 (10/08 1536) BP: (122-174)/(93-124) 174/124 (10/08 1536) SpO2:  [96 %-99 %] 99 % (10/08 1536) Weight:  [64.5 kg] 64.5 kg (10/08 1426)  GENERAL: Awake, alert in NAD HEENT: - Normocephalic and atraumatic, dry mm, no LN++, no Thyromegally LUNGS - Clear to auscultation bilaterally with no wheezes CV - S1S2 RRR, no m/r/g, equal pulses bilaterally. ABDOMEN - Soft, nontender, nondistended with normoactive BS Ext: warm, well perfused, intact peripheral pulses  NEURO:  Mental Status and speech/language: Awake alert nonverbal.  Follows some simple commands. Cranial Nerves: PERRL  EOMI, visual fields full, no facial asymmetry, facial sensation intact, hearing intact, tongue/uvula/soft palate midlinemuscle strength. No evidence of tongue atrophy or fibrillations Motor: Antigravity in the bilateral upper and lower extremities with subtle right hemiparesis with increased tone on the right. Sensation- Intact to light touch bilaterally Coordination: Difficult to perform given his mentation Gait- deferred  NIHSS 1a Level of Conscious.: 0 1b LOC Questions: 2 1c LOC Commands: 0 2 Best Gaze: 0 3 Visual: 0 4 Facial Palsy: 0 5a Motor Arm - left: 0 5b Motor Arm - Right: 0 6a Motor Leg - Left: 0 6b Motor Leg - Right: 0 7 Limb Ataxia: 0 8 Sensory: 0 9 Best Language: 3 10 Dysarthria: 2 11 Extinct. and Inatten.:  0 TOTAL: 7    Labs I have reviewed labs in epic and the results pertinent to this consultation are:   CBC    Component Value Date/Time   WBC 8.0 09/14/2021 1433   RBC 4.11 (L) 09/14/2021 1433   HGB 12.9 (L) 09/14/2021 1433   HCT 36.9 (L) 09/14/2021 1433   PLT 319 09/14/2021 1433   MCV 89.8 09/14/2021 1433   MCH 31.4 09/14/2021 1433   MCHC 35.0 09/14/2021 1433   RDW 13.0 09/14/2021 1433   LYMPHSABS 1.2 09/14/2021 1433   MONOABS 0.4 09/14/2021 1433   EOSABS 0.1 09/14/2021 1433   BASOSABS 0.0 09/14/2021 1433    CMP     Component Value Date/Time   NA 140 09/14/2021 1433   K 3.8 09/14/2021 1433   CL 106 09/14/2021 1433   CO2 27 09/14/2021 1433   GLUCOSE 119 (H) 09/14/2021 1433  BUN 19 09/14/2021 1433   CREATININE 1.24 09/14/2021 1433   CALCIUM 9.5 09/14/2021 1433   PROT 6.5 04/05/2018 2024   ALBUMIN 3.5 04/05/2018 2024   AST 26 04/05/2018 2024   ALT 23 04/05/2018 2024   ALKPHOS 31 (L) 04/05/2018 2024   BILITOT 0.5 04/05/2018 2024   GFRNONAA >60 09/14/2021 1433   GFRAA 29 (L) 04/05/2018 2024   Imaging I have reviewed the images obtained:  CT-head IMPRESSION: Motion degraded exam.   3.3 x 2.7 x 3.3 cm acute parenchymal hemorrhage within the left parietal (and possibly occipital) lobes. Mild surrounding edema with mass effect and partial effacement of the posterior left lateral ventricle. No midline shift.   There is mild extension of hemorrhage into the left subdural space, along the left aspect of the posterior falx and left tentorium, measuring up to 3 mm in greatest thickness.   Chronic small-vessel infarcts within the bilateral corona radiata/basal ganglia and right external capsule.   Background moderate chronic small vessel ischemic changes within the cerebral white matter.   Assessment: 65 year old with new onset seizure noted to have a left parietal occipital intraparenchymal hemorrhage, hypertensive on my exam although came in with reasonable  blood pressures. Given prior history of strokes, either primary ICH or hemorrhagic conversion of a new ischemic stroke causing new onset seizures seems to be the plausible etiology. Patient's family requested multiple times transfer to [facility] for continuity of care and preference. ER is reaching out to [facility] to see if they have any availability otherwise I recommend we transfer him to our comprehensive stroke center at [facility].  Impression: -Intraparenchymal hemorrhage-likely hypertensive versus hemorrhagic transformation of ischemic stroke -New onset seizure-likely in the setting of the intraparenchymal hemorrhage -Hypertensive emergency  Recommendations:  Transfer to tertiary care center-family prefers [facility].  We are attempting to call.  If accepted to [facility], can be transferred to [facility].  Otherwise will need transfer to [facility].  I will talk to my partners for transfer at [facility] if need be, if he cannot be transferred to [facility].  -Strict blood pressure goal of systolic less than 140.  Labetalol as needed and Cleviprex drip if needed.  Orders placed in the chart. -No antiplatelets or anticoagulants -Repeat head CT in 6 hours -CTA head with 5-minute delay to evaluate for any spot sign indicating ongoing hemorrhage -MRI brain with and without contrast when able to -Check labs to include A1c, lipid panel.  Check echocardiogram.  For seizure - maintain precautions, Keppra 500 BID after the load.   Plan discussed with Dr. Derrill Kay  -- Milon Dikes, MD Neurologist [facility] Pager: [phone]   CRITICAL CARE ATTESTATION Performed by: Milon Dikes, MD Total critical care time: 40 minutes Critical care time was exclusive of separately billable procedures and treating other patients and/or supervising APPs/Residents/Students Critical care was necessary to treat or prevent imminent or life-threatening deterioration due to ICH, hypertensive emergency   This patient is critically ill and at significant risk for neurological worsening and/or death and care requires constant monitoring. Critical care was time spent personally by me on the following activities: development of treatment plan with patient and/or surrogate as well as nursing, discussions with consultants, evaluation of patient's response to treatment, examination of patient, obtaining history from patient or surrogate, ordering and performing treatments and interventions, ordering and review of laboratory studies, ordering and review of radiographic studies, pulse oximetry, re-evaluation of patient's condition, participation in multidisciplinary rounds and medical decision making of high  complexity in the care of this patient.

## 2021-09-14 NOTE — ED Notes (Signed)
Images powershared to [facility]  1550

## 2021-09-14 NOTE — Progress Notes (Signed)
Notified by Dr. Derrill Kay patient accepted to [facility] and being transferred there.  -- Milon Dikes, MD Neurologist [facility] Pager: [phone]

## 2021-09-14 NOTE — ED Provider Notes (Signed)
-----------------------------------------   3:08 PM on 09/14/2021 -----------------------------------------  At approximately 2:50 PM I reviewed the CT images after being alerted by the CT tech and noted an intraparenchymal hemorrhage.  The patient returned to the room around this time.  I did an initial assessment on the patient.  He is maintaining his airway, arousable, and following commands.  Vital signs are stable.  Neuro exam is nonfocal.  I informed the nurse and ordered Keppra and additional labs.  Dr. Derrill Kay then arrived at 3pm and I informed him about the patient; he will be taking over the case.   Dionne Bucy, MD 09/14/21 1510

## 2021-09-14 NOTE — ED Notes (Signed)
Patient to ct scan.

## 2021-09-14 NOTE — ED Notes (Addendum)
Pt not able to sign consent for transfer. Attempted to call Mark Shepard, cousin listed at [phone] on paperwork provided from nursing home with no answer and unable to leave a voicemail. No family available for contact to call for consent for transfer.

## 2021-09-24 ENCOUNTER — Ambulatory Visit: Payer: Medicare Other | Admitting: Podiatry

## 2021-09-25 ENCOUNTER — Emergency Department
Admission: EM | Admit: 2021-09-25 | Discharge: 2021-09-25 | Disposition: A | Discharge status: Home / Self Care | Payer: Medicare Other | Attending: Emergency Medicine | Admitting: Emergency Medicine

## 2021-09-25 ENCOUNTER — Other Ambulatory Visit: Payer: Self-pay

## 2021-09-25 ENCOUNTER — Emergency Department: Payer: Medicare Other | Admitting: Radiology

## 2021-09-25 DIAGNOSIS — Z7982 Long term (current) use of aspirin: Secondary | ICD-10-CM | POA: Diagnosis not present

## 2021-09-25 DIAGNOSIS — I129 Hypertensive chronic kidney disease with stage 1 through stage 4 chronic kidney disease, or unspecified chronic kidney disease: Secondary | ICD-10-CM | POA: Diagnosis not present

## 2021-09-25 DIAGNOSIS — N183 Chronic kidney disease, stage 3 unspecified: Secondary | ICD-10-CM | POA: Diagnosis not present

## 2021-09-25 DIAGNOSIS — Y732 Prosthetic and other implants, materials and accessory gastroenterology and urology devices associated with adverse incidents: Secondary | ICD-10-CM | POA: Diagnosis not present

## 2021-09-25 DIAGNOSIS — Z87891 Personal history of nicotine dependence: Secondary | ICD-10-CM | POA: Diagnosis not present

## 2021-09-25 DIAGNOSIS — Z79899 Other long term (current) drug therapy: Secondary | ICD-10-CM | POA: Diagnosis not present

## 2021-09-25 DIAGNOSIS — T85528A Displacement of other gastrointestinal prosthetic devices, implants and grafts, initial encounter: Secondary | ICD-10-CM | POA: Insufficient documentation

## 2021-09-25 HISTORY — PX: IR REPLC GASTRO/COLONIC TUBE PERCUT W/FLUORO: IMG2333

## 2021-09-25 MED ORDER — LIDOCAINE VISCOUS HCL 2 % MT SOLN
OROMUCOSAL | Status: AC
  Filled 2021-09-25: qty 15

## 2021-09-25 MED ORDER — LIDOCAINE VISCOUS HCL 2 % MT SOLN
OROMUCOSAL | Status: DC | PRN
  Administered 2021-09-25: 15 mL

## 2021-09-25 MED ORDER — IOHEXOL 350 MG/ML SOLN
20.0000 mL | Freq: Once | INTRAVENOUS | Status: AC | PRN
  Administered 2021-09-25: 20 mL
  Filled 2021-09-25: qty 20

## 2021-09-25 NOTE — ED Triage Notes (Signed)
[facility].  Pt nodding head to answer questions yes and no. Pt is not verbally speaking, according to paperwork pt has aphasia

## 2021-09-25 NOTE — ED Provider Notes (Signed)
[facility] Emergency Department Provider Note   ____________________________________________   Event Date/Time   First MD Initiated Contact with Patient 09/25/21 1247     (approximate)  I have reviewed the triage vital signs and the nursing notes.   HISTORY  Chief Complaint No chief complaint on file.   HPI Mark Shepard is a 65 y.o. male with a history of aphasia who presents from [facility] with complaints of G-tube dislodgment.  Patient is nonverbal and therefore history is obtained from facility staff via EMS who state that patient dislodged his G-tube approximately 3 to 4 hours prior to arrival and "would not let staff put it back in".  Patient presents and can nod yes and no to any questions and when asked whether he would be comfortable with me attempting to put the G-tube back and he nods yes.          Past Medical History:  Diagnosis Date   Hypertension     Patient Active Problem List   Diagnosis Date Noted   Acute ischemic stroke (HCC) 06/11/2021   Encounter for medical examination to establish care 11/23/2020   Lumbar radiculopathy 11/23/2020   Chronic midline low back pain without sciatica 10/03/2019   Cervical myelopathy (HCC) 05/09/2019   Primary osteoarthritis involving multiple joints 06/03/2017   Alcohol use disorder, severe, in early remission (HCC) 05/29/2016   Chronic kidney disease, stage 3 (HCC) 06/28/2015   Cardiomyopathy (HCC) 05/19/2014   ICH (intracerebral hemorrhage) (HCC) 11/07/2013   Hypertension 07/06/2013    No past surgical history on file.  Prior to Admission medications   Medication Sig Start Date End Date Taking? Authorizing Provider  acetaminophen (TYLENOL) 325 MG tablet Take 650 mg by mouth every 4 (four) hours as needed for mild pain or moderate pain.    [provider]  amLODipine (NORVASC) 10 MG tablet Take 10 mg by mouth daily.    [provider]  aspirin 81 MG EC tablet Take  81 mg by mouth daily.    [provider]  atorvastatin (LIPITOR) 80 MG tablet Take 80 mg by mouth every evening.    [provider]  Carboxymethylcellulose Sodium (THERATEARS) 0.25 % SOLN Place 2 drops into both eyes 4 (four) times daily as needed (dry eyes).    [provider]  carvedilol (COREG) 12.5 MG tablet Take 12.5 mg by mouth 2 (two) times daily with a meal.    [provider]  diclofenac Sodium (VOLTAREN) 1 % GEL Apply 2 g topically 4 (four) times daily. (Apply to right shoulder)    [provider]  FLUoxetine (PROZAC) 40 MG capsule Take 40 mg by mouth daily.    [provider]  gabapentin (NEURONTIN) 100 MG capsule Take 100 mg by mouth 2 (two) times daily.    [provider]  glycopyrrolate (ROBINUL) 1 MG tablet Take 1 mg by mouth 3 (three) times daily.    [provider]  lidocaine (LIDODERM) 5 % Place 2 patches onto the skin daily. Remove & Discard patch within 12 hours or as directed by MD    [provider]  lisinopril (ZESTRIL) 20 MG tablet Take 20 mg by mouth daily.    [provider]  Multiple Vitamins-Minerals (THERA-M) TABS Take 1 tablet by mouth daily.    [provider]  nitroGLYCERIN (NITROSTAT) 0.4 MG SL tablet Place 0.4 mg under the tongue every 5 (five) minutes as needed for chest pain.    [provider]  polyethylene glycol (MIRALAX / GLYCOLAX) 17 g packet Take 17 g by mouth daily.    [provider]  scopolamine (TRANSDERM-SCOP) 1 MG/3DAYS Place 1 patch onto the skin every 3 (three) days.    [provider]  senna (SENOKOT) 8.6 MG TABS tablet Take 2 tablets by mouth at bedtime.    [provider]  thiamine 100 MG tablet Take 100 mg by mouth daily.    [provider]  tiZANidine (ZANAFLEX) 2 MG tablet Take 2 mg by mouth every 8 (eight) hours.    [provider]  Vitamin D, Ergocalciferol, (DRISDOL) 1.25 MG (50000 UNIT)  CAPS capsule Take 50,000 Units by mouth every 30 (thirty) days. (First of the month)    [provider]    Allergies Patient has no known allergies.  No family history on file.  Social History Social History   Tobacco Use   Smoking status: Former   Smokeless tobacco: Never  Substance Use Topics   Alcohol use: Yes    Alcohol/week: 8.0 standard drinks    Types: 2 Cans of beer, 6 Shots of liquor per week    Comment: alot   Drug use: Not Currently    Review of Systems Unable to assess ____________________________________________   PHYSICAL EXAM:  VITAL SIGNS: ED Triage Vitals [09/25/21 1219]  Enc Vitals Group     BP (!) 116/91     Pulse Rate (!) 103     Resp 20     Temp 98.4 F (36.9 C)     Temp Source Oral     SpO2 98 %     Weight 143 lb 4.8 oz (65 kg)     Height 6' (1.829 m)     Head Circumference      [facility] Flow      Pain Score 0     Pain Loc      Pain Edu?      Excl. in GC?    Constitutional: Alert and oriented. Well appearing and in no acute distress. Eyes: Conjunctivae are normal. PERRL. Head: Atraumatic. Nose: No congestion/rhinnorhea. Mouth/Throat: Mucous membranes are moist. Neck: No stridor Cardiovascular: Grossly normal heart sounds.  Good peripheral circulation. Respiratory: Normal respiratory effort.  No retractions. Gastrointestinal: Soft and nontender. No distention.  Stoma present in the left upper quadrant with granulation tissue and no active bleeding or surrounding erythema Musculoskeletal: No obvious deformities Neurologic:  Normal speech and language. No gross focal neurologic deficits are appreciated. Skin:  Skin is warm and dry. No rash noted. Psychiatric: Mood and affect are normal. Speech and behavior are normal.  ____________________________________________   LABS (all labs ordered are listed, but only abnormal results are displayed)  Labs Reviewed - No data to  display ____________________________________________  PROCEDURES  Procedure(s) performed (including Critical Care):  Procedures   ____________________________________________   INITIAL IMPRESSION / ASSESSMENT AND PLAN / ED COURSE  As part of my medical decision making, I reviewed the following data within the electronic medical record, if available:  Nursing notes reviewed and incorporated, Labs reviewed, EKG interpreted, Old chart reviewed, Radiograph reviewed and Notes from prior ED visits reviewed and incorporated        Patient 65 year old male with the above-stated past medical history presents after G-tube dislodgment.  Attempted replacement with a 65 [place] G-tube, which is a downsize from his currently listed 46 [place] G-tube, was unsuccessful.  I spoke to Dr. Claudette Laws interventional radiology who agrees to replace this G-tube under IR guidance.  Dispo: Discharge back to long-term care facility      ____________________________________________   FINAL CLINICAL IMPRESSION(S) / ED DIAGNOSES  Final diagnoses:  Dislodged gastrostomy tube     ED Discharge Orders     None        Note:  This document was prepared using Dragon voice recognition software and may include unintentional dictation errors.    Merwyn Katos, MD 09/25/21 [phone]

## 2021-09-25 NOTE — ED Notes (Signed)
Pt in IR 

## 2021-09-25 NOTE — ED Notes (Signed)
See triage note  here for g-tube replacement  no other complaints

## 2021-09-25 NOTE — ED Notes (Signed)
EDP at bedside  

## 2021-09-25 NOTE — ED Triage Notes (Signed)
First nurse note: pt comes ems from [facility]. Facility states pt wouldn't let facility replace g tube. Larey Seat out about 3-4 hours ago. Now wants to be seen.

## 2021-10-15 ENCOUNTER — Other Ambulatory Visit: Payer: Self-pay | Admitting: Internal Medicine

## 2021-10-15 DIAGNOSIS — R1312 Dysphagia, oropharyngeal phase: Secondary | ICD-10-CM

## 2021-10-18 ENCOUNTER — Ambulatory Visit
Admission: RE | Admit: 2021-10-18 | Discharge: 2021-10-18 | Disposition: A | Discharge status: Home / Self Care | Payer: Medicare Other | Source: Ambulatory Visit | Attending: Internal Medicine | Admitting: Internal Medicine

## 2021-10-18 ENCOUNTER — Other Ambulatory Visit: Payer: Self-pay

## 2021-10-18 DIAGNOSIS — R1312 Dysphagia, oropharyngeal phase: Secondary | ICD-10-CM | POA: Insufficient documentation

## 2021-10-18 NOTE — Progress Notes (Signed)
Modified Barium Swallow Progress Note  Patient Details  Name: Mark Shepard MRN: [id number] Date of Birth: 03/19/1956  Today's Date: 10/18/2021  Modified Barium Swallow completed.  Full report located under Chart Review in the Imaging Section.  Brief recommendations include the following:  Clinical Impression  Pt presents with moderate oral phase dysphagia and adequate pharyngeal phase abilities when consuming honey thick liquids, nectar thick liquids, thin liquids via spoon and cup as well as puree and dysphagia 2 items. Pt's oral phase is c/b decreased lingual manipulation of boluses and impaired mastication. This results in mildly prolonged oral phase and premature spillage of all trials. Pt's pharyngeal phase is adequate as his swallow initiation occurs just before the head of bolus enters the vallecula. Pt demonstrated great airway protection across al consistencies and trials. At this time, recommend initial diet of puree with thin liquids via cup (single sips), medicine crushed in puree. Further diet upgrade to minced foods can be completed at bedside with SLP at facility.   Swallow Evaluation Recommendations       SLP Diet Recommendations: Dysphagia 1 (Puree) solids;Thin liquid   Liquid Administration via: Cup   Medication Administration: Crushed with puree   Supervision: Full supervision/cueing for compensatory strategies;Intermittent supervision to cue for compensatory strategies   Compensations: Minimize environmental distractions;Slow rate;Small sips/bites   Postural Changes: Seated upright at 90 degrees   Oral Care Recommendations: Oral care BID      Jadyn Barge B. Dreama Saa M.S., CCC-SLP, [facility] Speech-Language Pathologist Rehabilitation Services Office [phone]   Mando Blatz 10/18/2021,1:14 PM

## 2021-12-18 ENCOUNTER — Other Ambulatory Visit: Payer: Self-pay | Admitting: Physician Assistant

## 2021-12-18 DIAGNOSIS — R9389 Abnormal findings on diagnostic imaging of other specified body structures: Secondary | ICD-10-CM

## 2021-12-30 ENCOUNTER — Other Ambulatory Visit: Payer: Self-pay | Admitting: Geriatric Medicine

## 2021-12-31 ENCOUNTER — Other Ambulatory Visit: Payer: Self-pay | Admitting: Geriatric Medicine

## 2021-12-31 DIAGNOSIS — R1310 Dysphagia, unspecified: Secondary | ICD-10-CM

## 2022-01-01 ENCOUNTER — Ambulatory Visit: Payer: Medicare Other

## 2022-01-02 ENCOUNTER — Other Ambulatory Visit: Payer: Self-pay | Admitting: Physician Assistant

## 2022-01-02 ENCOUNTER — Ambulatory Visit
Admission: RE | Admit: 2022-01-02 | Discharge: 2022-01-02 | Disposition: A | Discharge status: Home / Self Care | Payer: Medicare Other | Source: Ambulatory Visit | Attending: Physician Assistant | Admitting: Physician Assistant

## 2022-01-02 ENCOUNTER — Other Ambulatory Visit: Payer: Self-pay

## 2022-01-02 DIAGNOSIS — R9389 Abnormal findings on diagnostic imaging of other specified body structures: Secondary | ICD-10-CM | POA: Insufficient documentation

## 2022-01-17 ENCOUNTER — Ambulatory Visit
Admission: RE | Admit: 2022-01-17 | Discharge: 2022-01-17 | Disposition: A | Discharge status: Home / Self Care | Payer: Medicare Other | Source: Ambulatory Visit | Attending: Geriatric Medicine | Admitting: Geriatric Medicine

## 2022-01-17 DIAGNOSIS — R1310 Dysphagia, unspecified: Secondary | ICD-10-CM | POA: Diagnosis present

## 2022-01-18 NOTE — Progress Notes (Signed)
Modified Barium Swallow Progress Note  Patient Details  Name: Mark Shepard MRN: [id number] Date of Birth: 08/08/1956  Today's Date: 01/18/2022  Modified Barium Swallow completed.  Full report located under Chart Review in the Imaging Section.  Brief recommendations include the following:  Clinical Impression  Pt presents for repeat instrumental to objectively assess his ability to protect his airway when consuming advanced solids. Pt continues with chronic profound oral phase deficits that result in prolonged mastication of solids but pt is functional and is appropriate to consume more advanced solids at his facility. Unfortunately, pt's pharyngeal phase has declined as he is now silently aspirating thin liquids via single cup sips. Pt presents with delayed swallow initiation with bolus resting in the pyriform sinuses before initiating a swallow. Unfortunately, with cup sips, he aspirated during the swallow as his pyriform sinuses were not able to contain the larger bolus. However when presented with 8ml of thin liquids, pt is able to contain the liquid and he didn't display any aspiration. As such, recommend pt consume advanced solids with thin liquids VIA RESTRICTED FLOW CUP. If a restricted flow cup is not readily available, pt should consume nectar thick liquids via cup until one is available.   Swallow Evaluation Recommendations       SLP Diet Recommendations: Regular solids;Thin liquid   Liquid Administration via:  (69ml RESTRICTED FLOW CUP)   Medication Administration: Crushed with puree   Supervision: Intermittent supervision to cue for compensatory strategies   Compensations: Minimize environmental distractions;Slow rate;Small sips/bites   Postural Changes: Seated upright at 90 degrees   Oral Care Recommendations: Oral care BID       Samreen Seltzer B. Dreama Saa M.S., CCC-SLP, [facility] Speech-Language Pathologist Rehabilitation Services Office [phone]  Orella Cushman  Dreama Saa 01/18/2022,1:26 PM

## 2022-02-28 ENCOUNTER — Encounter: Payer: Self-pay | Admitting: Emergency Medicine

## 2022-02-28 ENCOUNTER — Other Ambulatory Visit: Payer: Self-pay

## 2022-02-28 ENCOUNTER — Emergency Department
Admission: EM | Admit: 2022-02-28 | Discharge: 2022-02-28 | Disposition: A | Discharge status: Skilled Nursing Facility | Payer: Medicare Other | Attending: Emergency Medicine | Admitting: Emergency Medicine

## 2022-02-28 ENCOUNTER — Emergency Department: Payer: Medicare Other

## 2022-02-28 DIAGNOSIS — N189 Chronic kidney disease, unspecified: Secondary | ICD-10-CM | POA: Insufficient documentation

## 2022-02-28 DIAGNOSIS — S0990XA Unspecified injury of head, initial encounter: Secondary | ICD-10-CM | POA: Diagnosis not present

## 2022-02-28 DIAGNOSIS — M25551 Pain in right hip: Secondary | ICD-10-CM | POA: Diagnosis present

## 2022-02-28 DIAGNOSIS — I129 Hypertensive chronic kidney disease with stage 1 through stage 4 chronic kidney disease, or unspecified chronic kidney disease: Secondary | ICD-10-CM | POA: Diagnosis not present

## 2022-02-28 DIAGNOSIS — W19XXXA Unspecified fall, initial encounter: Secondary | ICD-10-CM | POA: Insufficient documentation

## 2022-02-28 LAB — CBC WITH DIFFERENTIAL/PLATELET
Abs Immature Granulocytes: 0.01 10*3/uL (ref 0.00–0.07)
Basophils Absolute: 0 10*3/uL (ref 0.0–0.1)
Basophils Relative: 1 %
Eosinophils Absolute: 0.4 10*3/uL (ref 0.0–0.5)
Eosinophils Relative: 8 %
HCT: 39.7 % (ref 39.0–52.0)
Hemoglobin: 12.8 g/dL — ABNORMAL LOW (ref 13.0–17.0)
Immature Granulocytes: 0 %
Lymphocytes Relative: 41 %
Lymphs Abs: 2 10*3/uL (ref 0.7–4.0)
MCH: 30.1 pg (ref 26.0–34.0)
MCHC: 32.2 g/dL (ref 30.0–36.0)
MCV: 93.4 fL (ref 80.0–100.0)
Monocytes Absolute: 0.6 10*3/uL (ref 0.1–1.0)
Monocytes Relative: 13 %
Neutro Abs: 1.8 10*3/uL (ref 1.7–7.7)
Neutrophils Relative %: 37 %
Platelets: 237 10*3/uL (ref 150–400)
RBC: 4.25 MIL/uL (ref 4.22–5.81)
RDW: 13 % (ref 11.5–15.5)
WBC: 5 10*3/uL (ref 4.0–10.5)
nRBC: 0 % (ref 0.0–0.2)

## 2022-02-28 LAB — BASIC METABOLIC PANEL
Anion gap: 7 (ref 5–15)
BUN: 26 mg/dL — ABNORMAL HIGH (ref 8–23)
CO2: 24 mmol/L (ref 22–32)
Calcium: 7.9 mg/dL — ABNORMAL LOW (ref 8.9–10.3)
Chloride: 109 mmol/L (ref 98–111)
Creatinine, Ser: 1.25 mg/dL — ABNORMAL HIGH (ref 0.61–1.24)
GFR, Estimated: >60 mL/min (ref >60)
Glucose, Bld: 96 mg/dL (ref 70–99)
Potassium: 3.6 mmol/L (ref 3.5–5.1)
Sodium: 140 mmol/L (ref 135–145)

## 2022-02-28 MED ORDER — LIDOCAINE 5 % EX PTCH
1.0000 | MEDICATED_PATCH | CUTANEOUS | Status: DC
  Administered 2022-02-28: 1 via TRANSDERMAL
  Filled 2022-02-28: qty 1

## 2022-02-28 MED ORDER — KETOROLAC TROMETHAMINE 30 MG/ML IJ SOLN
15.0000 mg | Freq: Once | INTRAMUSCULAR | Status: AC
  Administered 2022-02-28: 15 mg via INTRAVENOUS
  Filled 2022-02-28: qty 1

## 2022-02-28 MED ORDER — OXYCODONE HCL 5 MG PO TABS
5.0000 mg | ORAL_TABLET | Freq: Once | ORAL | Status: AC
  Administered 2022-02-28: 5 mg via ORAL
  Filled 2022-02-28: qty 1

## 2022-02-28 NOTE — ED Triage Notes (Signed)
Patient presents from [facility] post possible fall. Patient was found between door when staff entered room. Patient reports he did fall. Pt c/o right hip pain from fall. Denies LOC or hitting head with fall. Reports tripped and fell. Patient non-verbal but can understand questions. Communicates with writing. This is baseline. Pt currently alert and acting appropriately. ?

## 2022-02-28 NOTE — ED Notes (Addendum)
E-signature not working at this time. Pt verbalized understanding of D/C instructions, prescriptions and follow up care with no further questions at this time. Pt in NAD and ems here to transport patient back to [facility] ? ?

## 2022-02-28 NOTE — ED Notes (Signed)
[facility]  CALLED  TO TRANSPORT  PT  BACK  TO  [facility] ?

## 2022-02-28 NOTE — Discharge Instructions (Addendum)
Your x-ray and CTs were negative for any fracture.  You have a lot of osteoarthritis in your right hip which can cause some pain.  Your blood work looked good your calcium is a little bit low and this can be followed up with your primary care doctor.  Return to the ER if you develop worsening symptoms or any other concerns ?

## 2022-02-28 NOTE — ED Notes (Signed)
Pt able to ambulate but with limp. Pt reports using walker at residence when needed. ?

## 2022-02-28 NOTE — ED Provider Notes (Signed)
? ?[facility] ?Provider Note ? ? ? Event Date/Time  ? First MD Initiated Contact with Patient 02/28/22 [phone]   ?  (approximate) ? ? ?History  ? ?Fall ? ? ?HPI ? ?Mark Shepard is a 66 y.o. 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  Patient comes in for [facility] for possible fall.  Patient was found between the door when staff entered room and patient was endorsing that he had a fall.  Patient is having right hip pain patient communicates with writing due to him being nonverbal from his prior stroke.  Patient reports having a fall where he thinks he is on the ground for about 20 minutes.  Sounds more like a mechanical fall.  Does not sound like he had any syncopal episode.  Does not sound like he hit his head.  He only reports pain in his right hip. ? ? ?Physical Exam  ? ?Triage Vital Signs: ?ED Triage Vitals [02/28/22 0921]  ?Enc Vitals Group  ?   BP   ?   Pulse   ?   Resp   ?   Temp   ?   Temp src   ?   SpO2   ?   Weight 143 lb 4.8 oz (65 kg)  ?   Height 6' (1.829 m)  ?   Head Circumference   ?   [facility] Flow   ?   Pain Score   ?   Pain Loc   ?   Pain Edu?   ?   Excl. in GC?   ? ? ?Most recent vital signs: ?Vitals:  ? 02/28/22 0924  ?BP: (!) 152/106  ?Pulse: (!) 58  ?Resp: 14  ?Temp: 98.2 ?F (36.8 ?C)  ?SpO2: 99%  ? ? ? ?General: Awake, no distress.  ?CV:  Good peripheral perfusion ?Resp:  Normal effort ?Abd:  No distention.  ?Other:  Some difficulty lifting up the right leg but he does have known right-sided weakness but he reports pain at the right hip.  Patient is nonverbal at baseline but is able to write with his right hand. Warm foot with good distal pulse.  ? ? ?ED Results / Procedures / Treatments  ? ?Labs ?(all labs ordered are listed, but only abnormal results are displayed) ?Labs Reviewed  ?CBC WITH  DIFFERENTIAL/PLATELET - Abnormal; Notable for the following components:  ?    Result Value  ? Hemoglobin 12.8 (*)   ? All other components within normal limits  ?BASIC METABOLIC PANEL - Abnormal; Notable for the following components:  ? BUN 26 (*)   ? Creatinine, Ser 1.25 (*)   ? Calcium 7.9 (*)   ? All other components within normal limits  ? ? ? ?EKG ? ?My interpretation of EKG: ? ?Normal sinus rate of 63 without any ST elevation or T wave inversions, normal intervals ? ?RADIOLOGY ?I have reviewed the xray personally and patient has a lot of osteoarthritis in the right hip but no obvious fracture.  Chest x-ray also personally evaluated and was negative ? ? ?PROCEDURES: ? ?Critical Care performed: No ? ?.1-3 Lead EKG Interpretation ?Performed by: Concha Se, MD ?Authorized by: Concha Se, MD  ? ?  Interpretation: abnormal   ?  ECG rate:  50 ?  ECG rate assessment: bradycardic   ?  Rhythm:  sinus bradycardia   ?  Ectopy: none   ?  Conduction: normal   ? ? ?MEDICATIONS ORDERED IN ED: ?Medications  ?lidocaine (LIDODERM) 5 % 1 patch (1 patch Transdermal Patch Applied 02/28/22 1000)  ?oxyCODONE (Oxy IR/ROXICODONE) immediate release tablet 5 mg (5 mg Oral Given 02/28/22 1044)  ? ? ? ?IMPRESSION / MDM / ASSESSMENT AND PLAN / ED COURSE  ?I reviewed the triage vital signs and the nursing notes. ?             ?               ? ?Differential diagnosis includes, but is not limited to, mechanical fall.  Seems less likely to be seizure versus syncope given what patient is reporting although somewhat limited due to having to communicate with writing.  X-ray ordered to evaluate for any hip fracture.  Patient states he does not think he hit his head but discussed with him getting a CT just to ensure given possible distracting injury of the right hip.  He expressed understanding.  Will place lidocaine patch on the right hip.  We will get some basic labs to evaluate for any Electra abnormalities, AKI.  Patient's heart rates are  kind of low but EKG does show that it is sinus in nature.  I did review his office visit from 2/24 was a heart rate was 51 this is at [facility] healthcare. ? ?BMP shows creatinine around baseline.  Calcium slightly low ?CBC shows stable hemoglobin ? ?CT is ordered to make sure there is no occult fracture, no intracranial hemorrhage, no cervical fracture and these were negative.  Patient does have severe osteoarthritis in the right hip joint.  Patient was able to ambulate with a limp.  He states that is his best baseline for him secondary to his osteoarthritis.  At this time I doubt there is an occult fracture that MRI is needed.  Therefore will discharge patient back to facility.  Patient expressed understanding felt comfortable with this plan ? ?The patient is on the cardiac monitor to evaluate for evidence of arrhythmia and/or significant heart rate changes. ? ? ?FINAL CLINICAL IMPRESSION(S) / ED DIAGNOSES  ? ?Final diagnoses:  ?Fall, initial encounter  ?Right hip pain  ? ? ? ?Rx / DC Orders  ? ?ED Discharge Orders   ? ? None  ? ?  ? ? ? ?Note:  This document was prepared using Dragon voice recognition software and may include unintentional dictation errors. ?  ?Concha Se, MD ?02/28/22 1157 ? ?

## 2022-02-28 NOTE — ED Notes (Signed)
Told pt the provider wanted him to get up and walk. Pt shook his head no,this NT will try aging at a later time. RN aware.  ?

## 2022-04-16 ENCOUNTER — Encounter: Payer: Self-pay | Admitting: Ophthalmology

## 2022-04-17 ENCOUNTER — Emergency Department: Payer: Medicare Other

## 2022-04-17 ENCOUNTER — Emergency Department
Admission: EM | Admit: 2022-04-17 | Discharge: 2022-04-17 | Disposition: A | Discharge status: Skilled Nursing Facility | Payer: Medicare Other | Attending: Emergency Medicine | Admitting: Emergency Medicine

## 2022-04-17 DIAGNOSIS — R531 Weakness: Secondary | ICD-10-CM | POA: Insufficient documentation

## 2022-04-17 DIAGNOSIS — I129 Hypertensive chronic kidney disease with stage 1 through stage 4 chronic kidney disease, or unspecified chronic kidney disease: Secondary | ICD-10-CM | POA: Insufficient documentation

## 2022-04-17 DIAGNOSIS — R079 Chest pain, unspecified: Secondary | ICD-10-CM

## 2022-04-17 DIAGNOSIS — R42 Dizziness and giddiness: Secondary | ICD-10-CM | POA: Diagnosis not present

## 2022-04-17 DIAGNOSIS — N189 Chronic kidney disease, unspecified: Secondary | ICD-10-CM | POA: Insufficient documentation

## 2022-04-17 DIAGNOSIS — R0789 Other chest pain: Secondary | ICD-10-CM | POA: Insufficient documentation

## 2022-04-17 LAB — COMPREHENSIVE METABOLIC PANEL
ALT: 19 U/L (ref 0–44)
AST: 17 U/L (ref 15–41)
Albumin: 3.5 g/dL (ref 3.5–5.0)
Alkaline Phosphatase: 47 U/L (ref 38–126)
Anion gap: 7 (ref 5–15)
BUN: 29 mg/dL — ABNORMAL HIGH (ref 8–23)
CO2: 26 mmol/L (ref 22–32)
Calcium: 9.1 mg/dL (ref 8.9–10.3)
Chloride: 108 mmol/L (ref 98–111)
Creatinine, Ser: 1.4 mg/dL — ABNORMAL HIGH (ref 0.61–1.24)
GFR, Estimated: 56 mL/min — ABNORMAL LOW (ref >60)
Glucose, Bld: 91 mg/dL (ref 70–99)
Potassium: 3.6 mmol/L (ref 3.5–5.1)
Sodium: 141 mmol/L (ref 135–145)
Total Bilirubin: 0.5 mg/dL (ref 0.3–1.2)
Total Protein: 7 g/dL (ref 6.5–8.1)

## 2022-04-17 LAB — CBC WITH DIFFERENTIAL/PLATELET
Abs Immature Granulocytes: 0.01 10*3/uL (ref 0.00–0.07)
Basophils Absolute: 0 10*3/uL (ref 0.0–0.1)
Basophils Relative: 1 %
Eosinophils Absolute: 0.4 10*3/uL (ref 0.0–0.5)
Eosinophils Relative: 6 %
HCT: 39.8 % (ref 39.0–52.0)
Hemoglobin: 13 g/dL (ref 13.0–17.0)
Immature Granulocytes: 0 %
Lymphocytes Relative: 31 %
Lymphs Abs: 1.7 10*3/uL (ref 0.7–4.0)
MCH: 30.3 pg (ref 26.0–34.0)
MCHC: 32.7 g/dL (ref 30.0–36.0)
MCV: 92.8 fL (ref 80.0–100.0)
Monocytes Absolute: 1.3 10*3/uL — ABNORMAL HIGH (ref 0.1–1.0)
Monocytes Relative: 24 %
Neutro Abs: 2.2 10*3/uL (ref 1.7–7.7)
Neutrophils Relative %: 38 %
Platelets: 310 10*3/uL (ref 150–400)
RBC: 4.29 MIL/uL (ref 4.22–5.81)
RDW: 13.2 % (ref 11.5–15.5)
WBC: 5.6 10*3/uL (ref 4.0–10.5)
nRBC: 0 % (ref 0.0–0.2)

## 2022-04-17 LAB — TROPONIN I (HIGH SENSITIVITY)
Troponin I (High Sensitivity): 6 ng/L (ref <18)
Troponin I (High Sensitivity): 5 ng/L (ref <18)

## 2022-04-17 NOTE — ED Triage Notes (Signed)
Pt brought from [facility] via EMS for chest pain. Pt took 3 of his nitros at [facility] and now has no complaints.  ?

## 2022-04-17 NOTE — Discharge Instructions (Signed)
Your work-up was reassuring without evidence of a heart attack and his chest pain resolved.  However if he develops return of chest pain he needs to return to the ER for repeat evaluation as well as needs to call the cardiology number to make a follow-up appointment.  His blood pressure was also elevated today and will need to be rechecked to ensure that he is not may need to have his blood pressure medicine adjusted ? ? ?

## 2022-04-17 NOTE — ED Provider Notes (Signed)
7:49 AM Assumed care for off going team.  ? ?Blood pressure (!) 181/124, pulse 84, temperature 98.5 ?F (36.9 ?C), temperature source Oral, resp. rate (!) 27, SpO2 100 %. ? ?See their HPI for full report but in brief patient comes in with chest pain pending repeat troponin and discharge if negative. ? ?Repeat troponin is negative.  On repeat assessment patient's denied any chest pain with nodding no.  I recommended he follow-up with a cardiologist.  He expressed understanding I asked patient if he would like a pen to ask me any questions and he declined.  I asked if he was just wanting to get back to his facility and he nodded yes. ? ?I discussed the provisional nature of ED diagnosis, the treatment so far, the ongoing plan of care, follow up appointments and return precautions with the patient and any family or support people present. They expressed understanding and agreed with the plan, discharged home. ? ? ? ? ? ?  ?Vanessa [place], MD ?04/17/22 [phone] ? ?

## 2022-04-17 NOTE — ED Notes (Signed)
Pt voided in urinal

## 2022-04-17 NOTE — ED Provider Notes (Signed)
? ?[facility] ?Provider Note ? ? ? None  ?  (approximate) ? ? ?History  ? ?Chest Pain ? ? ?HPI ? ?Mark Shepard is a 66 y.o. male who presents to the ED for evaluation of Chest Pain ?  ?I reviewed neurology clinic visit from 2/24.  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  Some right-sided weakness at baseline.  CKD, HTN and nonischemic cardiomyopathy. ? ?Patient presents to the ED from his SNF by EMS for evaluation of chest pain.  He reportedly walked to the nurses station and complaint of acute chest pain.  They provided nitroglycerin and aspirin with resolution of his symptoms. ? ?On arrival to the ED he reports feeling fine has no complaints.  He answers yes/no questions readily but is mute.  Denies chest pain last night, recent illnesses or fevers.  Reports he felt somewhat dizzy when going to bed last night, but no dizziness now.  No falls or injuries.  No shortness of breath, cough, abdominal pain or emesis. ? ? ?Physical Exam  ? ?Triage Vital Signs: ?ED Triage Vitals  ?Enc Vitals Group  ?   BP   ?   Pulse   ?   Resp   ?   Temp   ?   Temp src   ?   SpO2   ?   Weight   ?   Height   ?   Head Circumference   ?   Peak Flow   ?   Pain Score   ?   Pain Loc   ?   Pain Edu?   ?   Excl. in [place]?   ? ? ?Most recent vital signs: ?Vitals:  ? 04/17/22 0507 04/17/22 0630  ?BP: (!) 157/112 (!) 171/115  ?Pulse: 69 65  ?Resp: 18   ?Temp: 98.5 ?F (36.9 ?C)   ?SpO2: 99% 98%  ? ? ?General: Awake, no distress.  Able to transfer himself from the EMS stretcher to a room. ?CV:  Good peripheral perfusion.  ?Resp:  Normal effort.  ?Abd:  No distention.  Previous scar from G-tube.  ?MSK:  No deformity noted.  ?Neuro:  No acute focal deficits appreciated. ?Other:   ? ? ?ED Results / Procedures / Treatments  ? ?Labs ?(all labs ordered are listed, but only abnormal results are displayed) ?Labs Reviewed  ?COMPREHENSIVE METABOLIC PANEL - Abnormal;  Notable for the following components:  ?    Result Value  ? BUN 29 (*)   ? Creatinine, Ser 1.40 (*)   ? GFR, Estimated 56 (*)   ? All other components within normal limits  ?CBC WITH DIFFERENTIAL/PLATELET - Abnormal; Notable for the following components:  ? Monocytes Absolute 1.3 (*)   ? All other components within normal limits  ?TROPONIN I (HIGH SENSITIVITY)  ?TROPONIN I (HIGH SENSITIVITY)  ? ? ?EKG ?Sinus rhythm, rate of 66 bpm.  Normal axis and intervals.  Poor quality to the inferior leads but does not demonstrate any clear evidence of acute ischemia.  Similar to EKG from March ? ?RADIOLOGY ?CXR reviewed by me without evidence of acute cardiopulmonary pathology. ? ?Official radiology report(s): ?DG Chest Portable 1 View ? ?Result Date: 04/17/2022 ?CLINICAL DATA:  Chest pain which has subsequently resolved after taking 3 nitro pills. EXAM: PORTABLE CHEST 1 VIEW COMPARISON:  02/28/2022 FINDINGS: The heart size and mediastinal contours are within normal limits. Both lungs are clear. The visualized skeletal  structures are unremarkable. IMPRESSION: No active disease. Electronically Signed   By: Kerby Moors M.D.   On: 04/17/2022 06:04   ? ?PROCEDURES and INTERVENTIONS: ? ?.1-3 Lead EKG Interpretation ?Performed by: Vladimir Crofts, MD ?Authorized by: Vladimir Crofts, MD  ? ?  Interpretation: normal   ?  ECG rate:  66 ?  ECG rate assessment: normal   ?  Rhythm: sinus rhythm   ?  Ectopy: none   ?  Conduction: normal   ? ?Medications - No data to display ? ? ?IMPRESSION / MDM / ASSESSMENT AND PLAN / ED COURSE  ?I reviewed the triage vital signs and the nursing notes. ? ?66 year old male presents to the ED after an episode of chest pain this morning.  Feeling systemically well and has no symptoms here in the ED.  Is nonverbal, but well communicative.  Has a reassuring examination without evidence of tenderness, rash or trauma, neurologic or vascular deficits.  EKG is nonischemic and first troponin is negative.  CXR is clear  and CBC, CMP and troponin are reassuring.  CKD around baseline.  I considered observation admission for this patient, but he is strongly requesting discharge.  He is signed out to oncoming provider to follow-up on second troponin with anticipation of outpatient management as long as he has no recurrence of chest pain or signs of NSTEMI.  I believe he has capacity to make this decision. ? ?Clinical Course as of 04/17/22 0709  ?Thu Apr 17, 2022  ?0650 Reassessed.  Feeling well.  No chest pain.  He is requesting discharge.  I urged him to stay for second troponin and he is agreeable. [DS]  ?  ?Clinical Course User Index ?[DS] Vladimir Crofts, MD  ? ? ? ?FINAL CLINICAL IMPRESSION(S) / ED DIAGNOSES  ? ?Final diagnoses:  ?Other chest pain  ? ? ? ?Rx / DC Orders  ? ?ED Discharge Orders   ? ? None  ? ?  ? ? ? ?Note:  This document was prepared using Dragon voice recognition software and may include unintentional dictation errors. ?  ?Vladimir Crofts, MD ?04/17/22 [phone] ? ?

## 2022-04-22 NOTE — Discharge Instructions (Signed)

## 2022-04-24 ENCOUNTER — Ambulatory Visit: Payer: Medicare Other | Admitting: Anesthesiology

## 2022-04-24 ENCOUNTER — Ambulatory Visit
Admission: RE | Admit: 2022-04-24 | Discharge: 2022-04-24 | Disposition: A | Discharge status: Home / Self Care | Payer: Medicare Other | Attending: Ophthalmology | Admitting: Ophthalmology

## 2022-04-24 ENCOUNTER — Encounter: Payer: Self-pay | Admitting: Ophthalmology

## 2022-04-24 ENCOUNTER — Other Ambulatory Visit: Payer: Self-pay

## 2022-04-24 ENCOUNTER — Encounter
Admission: RE | Disposition: A | Discharge status: Home / Self Care | Payer: Self-pay | Source: Home / Self Care | Attending: Ophthalmology

## 2022-04-24 DIAGNOSIS — H2511 Age-related nuclear cataract, right eye: Secondary | ICD-10-CM | POA: Diagnosis present

## 2022-04-24 DIAGNOSIS — I69320 Aphasia following cerebral infarction: Secondary | ICD-10-CM | POA: Insufficient documentation

## 2022-04-24 DIAGNOSIS — I10 Essential (primary) hypertension: Secondary | ICD-10-CM | POA: Insufficient documentation

## 2022-04-24 DIAGNOSIS — R569 Unspecified convulsions: Secondary | ICD-10-CM | POA: Insufficient documentation

## 2022-04-24 DIAGNOSIS — I69351 Hemiplegia and hemiparesis following cerebral infarction affecting right dominant side: Secondary | ICD-10-CM | POA: Insufficient documentation

## 2022-04-24 DIAGNOSIS — Z87891 Personal history of nicotine dependence: Secondary | ICD-10-CM | POA: Diagnosis not present

## 2022-04-24 HISTORY — DX: Chronic kidney disease, stage 3 unspecified: N18.30

## 2022-04-24 HISTORY — DX: Cerebral infarction, unspecified: I63.9

## 2022-04-24 HISTORY — DX: Unspecified convulsions: R56.9

## 2022-04-24 HISTORY — DX: Disturbances of salivary secretion: K11.7

## 2022-04-24 HISTORY — DX: Depression, unspecified: F32.A

## 2022-04-24 HISTORY — PX: CATARACT EXTRACTION W/PHACO: SHX586

## 2022-04-24 HISTORY — DX: Hyperlipidemia, unspecified: E78.5

## 2022-04-24 HISTORY — DX: Weakness: R53.1

## 2022-04-24 HISTORY — DX: Aphasia: R47.01

## 2022-04-24 SURGERY — PHACOEMULSIFICATION, CATARACT, WITH IOL INSERTION
Anesthesia: Monitor Anesthesia Care | Site: Eye | Laterality: Right

## 2022-04-24 MED ORDER — CYCLOPENTOLATE HCL 2 % OP SOLN
1.0000 [drp] | OPHTHALMIC | Status: DC | PRN
  Administered 2022-04-24 (×3): 1 [drp] via OPHTHALMIC

## 2022-04-24 MED ORDER — SIGHTPATH DOSE#1 BSS IO SOLN
INTRAOCULAR | Status: DC | PRN
  Administered 2022-04-24: 15 mL via INTRAOCULAR

## 2022-04-24 MED ORDER — FENTANYL CITRATE (PF) 100 MCG/2ML IJ SOLN
INTRAMUSCULAR | Status: DC | PRN
  Administered 2022-04-24 (×2): 50 ug via INTRAVENOUS

## 2022-04-24 MED ORDER — SIGHTPATH DOSE#1 SODIUM HYALURONATE 23 MG/ML IO SOLUTION
PREFILLED_SYRINGE | INTRAOCULAR | Status: DC | PRN
  Administered 2022-04-24: .6 mL via INTRAOCULAR

## 2022-04-24 MED ORDER — TRYPAN BLUE 0.06 % IO SOSY
PREFILLED_SYRINGE | INTRAOCULAR | Status: DC | PRN
  Administered 2022-04-24: 0.5 mL via INTRAOCULAR

## 2022-04-24 MED ORDER — DEXMEDETOMIDINE (PRECEDEX) IN NS 20 MCG/5ML (4 MCG/ML) IV SYRINGE
PREFILLED_SYRINGE | INTRAVENOUS | Status: DC | PRN
  Administered 2022-04-24: 10 ug via INTRAVENOUS

## 2022-04-24 MED ORDER — PHENYLEPHRINE-KETOROLAC 1-0.3 % IO SOLN
INTRAOCULAR | Status: DC | PRN
  Administered 2022-04-24: 129 mL via OPHTHALMIC

## 2022-04-24 MED ORDER — SIGHTPATH DOSE#1 NA HYALUR & NA CHOND-NA HYALUR IO KIT
PACK | INTRAOCULAR | Status: DC | PRN
  Administered 2022-04-24: 1 via OPHTHALMIC

## 2022-04-24 MED ORDER — MOXIFLOXACIN HCL 0.5 % OP SOLN
OPHTHALMIC | Status: DC | PRN
  Administered 2022-04-24: 0.2 mL via OPHTHALMIC

## 2022-04-24 MED ORDER — BRIMONIDINE TARTRATE-TIMOLOL 0.2-0.5 % OP SOLN
OPHTHALMIC | Status: DC | PRN
  Administered 2022-04-24: 1 [drp] via OPHTHALMIC

## 2022-04-24 MED ORDER — LACTATED RINGERS IV SOLN: INTRAVENOUS | Status: DC

## 2022-04-24 MED ORDER — MIDAZOLAM HCL 2 MG/2ML IJ SOLN
INTRAMUSCULAR | Status: DC | PRN
  Administered 2022-04-24 (×2): 1 mg via INTRAVENOUS

## 2022-04-24 MED ORDER — TETRACAINE HCL 0.5 % OP SOLN
1.0000 [drp] | OPHTHALMIC | Status: DC | PRN
  Administered 2022-04-24 (×3): 1 [drp] via OPHTHALMIC

## 2022-04-24 MED ORDER — LIDOCAINE HCL (PF) 2 % IJ SOLN
INTRAOCULAR | Status: DC | PRN
  Administered 2022-04-24: 4 mL via INTRAOCULAR

## 2022-04-24 MED ORDER — TETRACAINE HCL 0.5 % OP SOLN
OPHTHALMIC | Status: DC | PRN
  Administered 2022-04-24: 2 [drp] via OPHTHALMIC

## 2022-04-24 MED ORDER — PHENYLEPHRINE HCL 10 % OP SOLN
1.0000 [drp] | OPHTHALMIC | Status: DC | PRN
  Administered 2022-04-24 (×3): 1 [drp] via OPHTHALMIC

## 2022-04-24 SURGICAL SUPPLY — 17 items
CANNULA ANT/CHMB 27G (MISCELLANEOUS) IMPLANT
CANNULA ANT/CHMB 27GA (MISCELLANEOUS) ×2 IMPLANT
CATARACT SUITE SIGHTPATH (MISCELLANEOUS) ×2 IMPLANT
DISSECTOR HYDRO NUCLEUS 50X22 (MISCELLANEOUS) ×2 IMPLANT
DRSG TEGADERM 2-3/8X2-3/4 SM (GAUZE/BANDAGES/DRESSINGS) ×2 IMPLANT
FEE CATARACT SUITE SIGHTPATH (MISCELLANEOUS) ×1 IMPLANT
GLOVE SURG GAMMEX PI TX LF 7.5 (GLOVE) ×2 IMPLANT
GLOVE SURG SYN 8.5  E (GLOVE) ×1
GLOVE SURG SYN 8.5 E (GLOVE) ×1 IMPLANT
GLOVE SURG SYN 8.5 PF PI (GLOVE) ×1 IMPLANT
LENS IOL TECNIS EYHANCE 18.5 (Intraocular Lens) ×1 IMPLANT
NDL FILTER BLUNT 18X1 1/2 (NEEDLE) ×1 IMPLANT
NEEDLE FILTER BLUNT 18X 1/2SAF (NEEDLE) ×1
NEEDLE FILTER BLUNT 18X1 1/2 (NEEDLE) ×1 IMPLANT
SYR 3ML LL SCALE MARK (SYRINGE) ×2 IMPLANT
SYR 5ML LL (SYRINGE) ×2 IMPLANT
WATER STERILE IRR 250ML POUR (IV SOLUTION) ×2 IMPLANT

## 2022-04-24 NOTE — Op Note (Signed)
OPERATIVE NOTE  Guransh Greeley [id number] 04/24/2022   PREOPERATIVE DIAGNOSIS: Nuclear sclerotic cataract right eye. H25.11   POSTOPERATIVE DIAGNOSIS: Nuclear sclerotic cataract right eye. H25.11   PROCEDURE:  Phacoemusification with posterior chamber intraocular lens placement of the right eye  Ultrasound time: Procedure(s): CATARACT EXTRACTION PHACO AND INTRAOCULAR LENS PLACEMENT (IOC) RIGHT COMPLICATED HEALON 5 VISION BLUE OMIDRIA 19.64 02:01.5 (Right)  LENS:   Implant Name Type Inv. Item Serial No. Manufacturer Lot No. LRB No. Used Action  LENS IOL TECNIS EYHANCE 18.5 - [id number] Intraocular Lens LENS IOL TECNIS EYHANCE 18.5 [id number] [employer]IGHT[employer]  Right 1 Implanted      SURGEON:  Courtney Heys. Lazarus Salines, MD   ANESTHESIA:  Topical with tetracaine drops, augmented with 1% preservative-free intracameral lidocaine.   COMPLICATIONS:  None.   DESCRIPTION OF PROCEDURE:  The patient was identified in the holding room and transported to the operating room and placed in the supine position under the operating microscope.  The right eye was identified as the operative eye, which was prepped and draped in the usual sterile ophthalmic fashion.   A 1 millimeter clear-corneal paracentesis was made superotemporally. Preservative-free 1% lidocaine mixed with 1:1,000 bisulfite-free aqueous solution of epinephrine was injected into the anterior chamber. Vision blue was used to stain the anterior capsulae due to no red reflex being present. The anterior chamber was then filled with Viscoat viscoelastic followed by Healon V underneath in a soft shell technique. A 2.4 millimeter keratome was used to make a clear-corneal incision inferotemporally. A curvilinear capsulorrhexis was made with a cystotome and capsulorrhexis forceps. Balanced salt solution was used to hydrodissect the nucleus. Phacoemulsification was then used to remove the lens nucleus and epinucleus. The remaining cortex was then removed using the  irrigation and aspiration handpiece. Healon V was then placed into the capsular bag to distend it for lens placement. An +18.50 D DIB00 intraocular lens was then injected into the capsular bag. The remaining viscoelastic was aspirated.   Wounds were hydrated with balanced salt solution.  The anterior chamber was inflated to a physiologic pressure with balanced salt solution.  No wound leaks were noted. Vigamox was injected intracamerally.  Timolol and Brimonidine drops were applied to the eye.  The patient was taken to the recovery room in stable condition without complications of anesthesia or surgery.  Maryann Alar [place] 04/24/2022, 1:07 PM

## 2022-04-24 NOTE — Anesthesia Postprocedure Evaluation (Signed)
Anesthesia Post Note  Patient: Mark Shepard  Procedure(s) Performed: CATARACT EXTRACTION PHACO AND INTRAOCULAR LENS PLACEMENT ([facility]) RIGHT COMPLICATED HEALON 5 VISION BLUE OMIDRIA 19.64 02:01.5 (Right: Eye)     Patient location during evaluation: PACU Anesthesia Type: MAC Level of consciousness: awake and alert Pain management: pain level controlled Vital Signs Assessment: post-procedure vital signs reviewed and stable Respiratory status: spontaneous breathing, nonlabored ventilation and respiratory function stable Cardiovascular status: stable and blood pressure returned to baseline Postop Assessment: no apparent nausea or vomiting Anesthetic complications: no   No notable events documented.  April Manson

## 2022-04-24 NOTE — Transfer of Care (Signed)
Immediate Anesthesia Transfer of Care Note  Patient: Mark Shepard  Procedure(s) Performed: CATARACT EXTRACTION PHACO AND INTRAOCULAR LENS PLACEMENT ([facility]) RIGHT COMPLICATED HEALON 5 VISION BLUE OMIDRIA 19.64 02:01.5 (Right: Eye)  Patient Location: PACU  Anesthesia Type: MAC  Level of Consciousness: awake, alert  and patient cooperative  Airway and Oxygen Therapy: Patient Spontanous Breathing and Patient connected to supplemental oxygen  Post-op Assessment: Post-op Vital signs reviewed, Patient's Cardiovascular Status Stable, Respiratory Function Stable, Patent Airway and No signs of Nausea or vomiting  Post-op Vital Signs: Reviewed and stable  Complications: No notable events documented.

## 2022-04-24 NOTE — Anesthesia Preprocedure Evaluation (Signed)
Anesthesia Evaluation  Patient identified by MRN, date of birth, ID band Patient awake    Reviewed: Allergy & Precautions, H&P , NPO status , Patient's Chart, lab work & pertinent test results, reviewed documented beta blocker date and time   Airway Mallampati: II  TM Distance: >3 FB Neck ROM: full    Dental no notable dental hx.    Pulmonary neg pulmonary ROS, former smoker,    Pulmonary exam normal breath sounds clear to auscultation       Cardiovascular Exercise Tolerance: Good hypertension, negative cardio ROS   Rhythm:regular Rate:Normal     Neuro/Psych Seizures -,  PSYCHIATRIC DISORDERS CVA (expressive aphasia, R side weakness), Residual Symptoms negative neurological ROS  negative psych ROS   GI/Hepatic negative GI ROS, Neg liver ROS,   Endo/Other  negative endocrine ROS  Renal/GU CRFRenal diseasenegative Renal ROS  negative genitourinary   Musculoskeletal  (+) Arthritis ,   Abdominal   Peds  Hematology negative hematology ROS (+)   Anesthesia Other Findings   Reproductive/Obstetrics negative OB ROS                             Anesthesia Physical Anesthesia Plan  ASA: 3  Anesthesia Plan: MAC   Post-op Pain Management:    Induction:   PONV Risk Score and Plan: 1 and Midazolam, TIVA and Treatment may vary due to age or medical condition  Airway Management Planned:   Additional Equipment:   Intra-op Plan:   Post-operative Plan:   Informed Consent: I have reviewed the patients History and Physical, chart, labs and discussed the procedure including the risks, benefits and alternatives for the proposed anesthesia with the patient or authorized representative who has indicated his/her understanding and acceptance.     Dental Advisory Given  Plan Discussed with: CRNA  Anesthesia Plan Comments:         Anesthesia Quick Evaluation

## 2022-04-24 NOTE — H&P (Signed)
[facility]   Primary Care Physician:  Milon Score Ophthalmologist: Dr. Deberah Pelton  Pre-Procedure History & Physical: HPI:  Mark Shepard is a 66 y.o. male here for cataract surgery.   Past Medical History:  Diagnosis Date   CKD (chronic kidney disease), stage III (HCC)    Depression    Expressive aphasia    S/P stroke. Communicates by writing.   Hyperlipidemia    Hypertension    Right sided weakness    S/P stroke   Salivary secretion disorder    Seizure (HCC)    Stroke [facility])     Past Surgical History:  Procedure Laterality Date   IR [facility] GASTRO/COLONIC TUBE PERCUT W/FLUORO  09/25/2021    Prior to Admission medications   Medication Sig Start Date End Date Taking? Authorizing Provider  acetaminophen (TYLENOL) 325 MG tablet Take 650 mg by mouth every 4 (four) hours as needed for mild pain or moderate pain.   Yes [provider]  Amino Acids-Protein Hydrolys (FEEDING SUPPLEMENT, PRO-STAT SUGAR FREE 64,) LIQD Take 30 mLs by mouth 2 (two) times daily with a meal.   Yes [provider]  amLODipine (NORVASC) 10 MG tablet Take 10 mg by mouth daily.   Yes [provider]  aspirin 81 MG EC tablet Take 81 mg by mouth daily.   Yes [provider]  atorvastatin (LIPITOR) 80 MG tablet Take 40 mg by mouth every evening.   Yes [provider]  baclofen (LIORESAL) 10 MG tablet Take 10 mg by mouth 2 (two) times daily.   Yes [provider]  carvedilol (COREG) 12.5 MG tablet Take 12.5 mg by mouth 2 (two) times daily with a meal.   Yes [provider]  diclofenac Sodium (VOLTAREN) 1 % GEL Apply 2 g topically 4 (four) times daily. (Apply to right shoulder)   Yes [provider]  FLUoxetine (PROZAC) 40 MG capsule Take 40 mg by mouth daily.   Yes [provider]  gabapentin (NEURONTIN) 100 MG capsule Take 300 mg by mouth 2 (two) times daily.   Yes [provider]  glycopyrrolate (ROBINUL) 1 MG  tablet Take 1 mg by mouth 3 (three) times daily.   Yes [provider]  lidocaine (LIDODERM) 5 % Place 2 patches onto the skin daily. Remove & Discard patch within 12 hours or as directed by MD   Yes [provider]  lisinopril (ZESTRIL) 20 MG tablet Take 20 mg by mouth daily.   Yes [provider]  Multiple Vitamins-Minerals (THERA-M) TABS Take 1 tablet by mouth daily.   Yes [provider]  nitroGLYCERIN (NITROSTAT) 0.4 MG SL tablet Place 0.4 mg under the tongue every 5 (five) minutes as needed for chest pain.   Yes [provider]  oxybutynin (DITROPAN-XL) 5 MG 24 hr tablet Take 5 mg by mouth daily.   Yes [provider]  polyethylene glycol (MIRALAX / GLYCOLAX) 17 g packet Take 17 g by mouth daily.   Yes [provider]  scopolamine (TRANSDERM-SCOP) 1 MG/3DAYS Place 1 patch onto the skin every 3 (three) days.   Yes [provider]  senna (SENOKOT) 8.6 MG TABS tablet Take 2 tablets by mouth at bedtime.   Yes [provider]  thiamine 100 MG tablet Take 100 mg by mouth daily.   Yes [provider]  valproic acid (DEPAKENE) 250 MG capsule Take 250 mg by mouth 4 (four) times daily.   Yes [provider]  Vitamin  D, Ergocalciferol, (DRISDOL) 1.25 MG (50000 UNIT) CAPS capsule Take 50,000 Units by mouth every 30 (thirty) days. (First of the month)   Yes [provider]  Carboxymethylcellulose Sodium (THERATEARS) 0.25 % SOLN Place 2 drops into both eyes 4 (four) times daily as needed (dry eyes).    [provider]    Allergies as of 03/11/2022   (No Known Allergies)    History reviewed. No pertinent family history.  Social History   Socioeconomic History   Marital status: Single    Spouse name: Not on file   Number of children: Not on file   Years of education: Not on file   Highest education level: Not on file  Occupational History   Not on file  Tobacco Use   Smoking  status: Former   Smokeless tobacco: Never  Vaping Use   Vaping Use: Unknown  Substance and Sexual Activity   Alcohol use: Not Currently    Alcohol/week: 8.0 standard drinks    Types: 2 Cans of beer, 6 Shots of liquor per week   Drug use: Not Currently   Sexual activity: Yes  Other Topics Concern   Not on file  Social History Narrative   Not on file   Social Determinants of Health   Financial Resource Strain: Not on file  Food Insecurity: Not on file  Transportation Needs: Not on file  Physical Activity: Not on file  Stress: Not on file  Social Connections: Not on file  Intimate Partner Violence: Not on file    Review of Systems: See HPI, otherwise negative ROS  Physical Exam: BP (!) 155/84   Pulse (!) 56   Temp (!) 97.3 F (36.3 C) (Temporal)   Ht 5\' 6"  (1.676 m)   Wt 63.5 kg   SpO2 97%   BMI 22.60 kg/m  General:   Alert, cooperative in NAD Head:  Normocephalic and atraumatic. Respiratory:  Normal work of breathing. Cardiovascular:  RRR  Impression/Plan: Mark Shepard is here for cataract surgery.  Risks, benefits, limitations, and alternatives regarding cataract surgery have been reviewed with the patient.  Questions have been answered.  All parties agreeable.   Norvel Richards, MD  04/24/2022, 11:48 AM

## 2022-04-25 ENCOUNTER — Encounter: Payer: Self-pay | Admitting: Ophthalmology

## 2022-06-11 ENCOUNTER — Ambulatory Visit
Admission: RE | Admit: 2022-06-11 | Discharge: 2022-06-11 | Disposition: A | Discharge status: Skilled Nursing Facility | Payer: Medicare Other | Attending: Ophthalmology | Admitting: Ophthalmology

## 2022-06-11 ENCOUNTER — Ambulatory Visit: Payer: Medicare Other | Admitting: Anesthesiology

## 2022-06-11 ENCOUNTER — Encounter: Payer: Self-pay | Admitting: Ophthalmology

## 2022-06-11 ENCOUNTER — Other Ambulatory Visit: Payer: Self-pay

## 2022-06-11 ENCOUNTER — Encounter
Admission: RE | Disposition: A | Discharge status: Skilled Nursing Facility | Payer: Self-pay | Source: Home / Self Care | Attending: Ophthalmology

## 2022-06-11 DIAGNOSIS — I129 Hypertensive chronic kidney disease with stage 1 through stage 4 chronic kidney disease, or unspecified chronic kidney disease: Secondary | ICD-10-CM | POA: Diagnosis not present

## 2022-06-11 DIAGNOSIS — F32A Depression, unspecified: Secondary | ICD-10-CM | POA: Insufficient documentation

## 2022-06-11 DIAGNOSIS — Z87891 Personal history of nicotine dependence: Secondary | ICD-10-CM | POA: Diagnosis not present

## 2022-06-11 DIAGNOSIS — I69351 Hemiplegia and hemiparesis following cerebral infarction affecting right dominant side: Secondary | ICD-10-CM | POA: Insufficient documentation

## 2022-06-11 DIAGNOSIS — H2512 Age-related nuclear cataract, left eye: Secondary | ICD-10-CM | POA: Insufficient documentation

## 2022-06-11 DIAGNOSIS — I69320 Aphasia following cerebral infarction: Secondary | ICD-10-CM | POA: Diagnosis not present

## 2022-06-11 DIAGNOSIS — N183 Chronic kidney disease, stage 3 unspecified: Secondary | ICD-10-CM | POA: Insufficient documentation

## 2022-06-11 HISTORY — PX: CATARACT EXTRACTION W/PHACO: SHX586

## 2022-06-11 SURGERY — PHACOEMULSIFICATION, CATARACT, WITH IOL INSERTION
Anesthesia: Monitor Anesthesia Care | Site: Eye | Laterality: Left

## 2022-06-11 MED ORDER — MOXIFLOXACIN HCL 0.5 % OP SOLN
OPHTHALMIC | Status: DC | PRN
  Administered 2022-06-11: 0.2 mL via OPHTHALMIC

## 2022-06-11 MED ORDER — ARMC OPHTHALMIC DILATING DROPS
OPHTHALMIC | Status: DC | PRN
Start: 2022-06-11 — End: 2022-06-11

## 2022-06-11 MED ORDER — PHENYLEPHRINE-KETOROLAC 1-0.3 % IO SOLN
INTRAOCULAR | Status: DC | PRN
  Administered 2022-06-11: 129 mL via OPHTHALMIC

## 2022-06-11 MED ORDER — ACETAMINOPHEN 160 MG/5ML PO SOLN: 325.0000 mg | ORAL | Status: DC | PRN

## 2022-06-11 MED ORDER — SIGHTPATH DOSE#1 NA HYALUR & NA CHOND-NA HYALUR IO KIT
PACK | INTRAOCULAR | Status: DC | PRN
  Administered 2022-06-11: 1 via OPHTHALMIC

## 2022-06-11 MED ORDER — BRIMONIDINE TARTRATE-TIMOLOL 0.2-0.5 % OP SOLN
OPHTHALMIC | Status: DC | PRN
  Administered 2022-06-11: 1 [drp] via OPHTHALMIC

## 2022-06-11 MED ORDER — TETRACAINE HCL 0.5 % OP SOLN
1.0000 [drp] | OPHTHALMIC | Status: DC | PRN
  Administered 2022-06-11 (×4): 1 [drp] via OPHTHALMIC

## 2022-06-11 MED ORDER — SIGHTPATH DOSE#1 BSS IO SOLN
INTRAOCULAR | Status: DC | PRN
  Administered 2022-06-11: 15 mL via INTRAOCULAR

## 2022-06-11 MED ORDER — LIDOCAINE HCL (PF) 2 % IJ SOLN
INTRAOCULAR | Status: DC | PRN
  Administered 2022-06-11: 4 mL via INTRAOCULAR

## 2022-06-11 MED ORDER — FENTANYL CITRATE (PF) 100 MCG/2ML IJ SOLN
INTRAMUSCULAR | Status: DC | PRN
  Administered 2022-06-11: 25 ug via INTRAVENOUS
  Administered 2022-06-11: 50 ug via INTRAVENOUS
  Administered 2022-06-11: 25 ug via INTRAVENOUS

## 2022-06-11 MED ORDER — MIDAZOLAM HCL 2 MG/2ML IJ SOLN
INTRAMUSCULAR | Status: DC | PRN
  Administered 2022-06-11 (×2): .5 mg via INTRAVENOUS
  Administered 2022-06-11: 1 mg via INTRAVENOUS

## 2022-06-11 MED ORDER — ONDANSETRON HCL 4 MG/2ML IJ SOLN: 4.0000 mg | Freq: Once | INTRAMUSCULAR | Status: DC | PRN

## 2022-06-11 MED ORDER — ACETAMINOPHEN 325 MG PO TABS: 325.0000 mg | ORAL_TABLET | ORAL | Status: DC | PRN

## 2022-06-11 SURGICAL SUPPLY — 16 items
CATARACT SUITE SIGHTPATH (MISCELLANEOUS) ×2 IMPLANT
DISSECTOR HYDRO NUCLEUS 50X22 (MISCELLANEOUS) ×2 IMPLANT
DRSG TEGADERM 2-3/8X2-3/4 SM (GAUZE/BANDAGES/DRESSINGS) ×2 IMPLANT
FEE CATARACT SUITE SIGHTPATH (MISCELLANEOUS) ×1 IMPLANT
GLOVE SURG GAMMEX PI TX LF 7.5 (GLOVE) ×2 IMPLANT
GLOVE SURG SYN 8.5  E (GLOVE) ×1
GLOVE SURG SYN 8.5 E (GLOVE) ×1 IMPLANT
GLOVE SURG SYN 8.5 PF PI (GLOVE) ×1 IMPLANT
LENS IOL TECNIS EYHANCE 19.0 (Intraocular Lens) ×1 IMPLANT
NDL FILTER BLUNT 18X1 1/2 (NEEDLE) ×1 IMPLANT
NEEDLE FILTER BLUNT 18X 1/2SAF (NEEDLE) ×1
NEEDLE FILTER BLUNT 18X1 1/2 (NEEDLE) ×1 IMPLANT
SYR 3ML LL SCALE MARK (SYRINGE) ×2 IMPLANT
SYR 5ML LL (SYRINGE) ×2 IMPLANT
TIP I/A CVD TIP (MISCELLANEOUS) ×1 IMPLANT
WATER STERILE IRR 250ML POUR (IV SOLUTION) ×2 IMPLANT

## 2022-06-11 NOTE — Transfer of Care (Signed)
Immediate Anesthesia Transfer of Care Note  Patient: Mark Shepard  Procedure(s) Performed: CATARACT EXTRACTION PHACO AND INTRAOCULAR LENS PLACEMENT ([place]) LEFT HEALON5 VISION BLUE [place] (Left: Eye)  Patient Location: PACU  Anesthesia Type: MAC  Level of Consciousness: awake, alert  and patient cooperative  Airway and Oxygen Therapy: Patient Spontanous Breathing and Patient connected to supplemental oxygen  Post-op Assessment: Post-op Vital signs reviewed, Patient's Cardiovascular Status Stable, Respiratory Function Stable, Patent Airway and No signs of Nausea or vomiting  Post-op Vital Signs: Reviewed and stable  Complications: No notable events documented.

## 2022-06-11 NOTE — Op Note (Signed)
OPERATIVE NOTE  Mark Shepard [id number] 06/11/2022   PREOPERATIVE DIAGNOSIS: Nuclear sclerotic cataract left eye. H25.12   POSTOPERATIVE DIAGNOSIS: Nuclear sclerotic cataract left eye. H25.12   PROCEDURE:  Phacoemusification with posterior chamber intraocular lens placement of the left eye  Ultrasound time: Procedure(s) with comments: CATARACT EXTRACTION PHACO AND INTRAOCULAR LENS PLACEMENT (IOC) LEFT HEALON5 VISION BLUE OMIDIRA (Left) - last case requested regular bed NOT eye bed 7.73 01:17.3  LENS:   Implant Name Type Inv. Item Serial No. Manufacturer Lot No. LRB No. Used Action  LENS IOL TECNIS EYHANCE 19.0 - [id number] Intraocular Lens LENS IOL TECNIS EYHANCE 19.0 [id number] [employer]  Left 1 Implanted      SURGEON:  Julious Payer. Rolley Sims, MD   ANESTHESIA:  Topical with tetracaine drops, augmented with 1% preservative-free intracameral lidocaine.   COMPLICATIONS:  None.   DESCRIPTION OF PROCEDURE:  The patient was identified in the holding room and transported to the operating room and placed in the supine position under the operating microscope.  The left eye was identified as the operative eye, which was prepped and draped in the usual sterile ophthalmic fashion.   A 1 millimeter clear-corneal paracentesis was made superotemporally. Preservative-free 1% lidocaine mixed with 1:1,000 bisulfite-free aqueous solution of epinephrine was injected into the anterior chamber. The anterior chamber was then filled with Viscoat viscoelastic. A 2.4 millimeter keratome was used to make a clear-corneal incision superonasally. A curvilinear capsulorrhexis was made with a cystotome and capsulorrhexis forceps. Balanced salt solution was used to hydrodissect and hydrodelineate the nucleus. Phacoemulsification was then used to remove the lens nucleus and epinucleus. The remaining cortex was then removed using the irrigation and aspiration handpiece. Provisc was then placed into the capsular bag to distend  it for lens placement. A +19.00 D DIB00 intraocular lens was then injected into the capsular bag. The remaining viscoelastic was aspirated.   Wounds were hydrated with balanced salt solution.  The anterior chamber was inflated to a physiologic pressure with balanced salt solution.  No wound leaks were noted. Vigamox was injected intracamerally.  Timolol and Brimonidine drops were applied to the eye.  The patient was taken to the recovery room in stable condition without complications of anesthesia or surgery.  Rolly Pancake [place] 06/11/2022, 10:33 AM

## 2022-06-11 NOTE — Anesthesia Procedure Notes (Signed)
Procedure Name: MAC Date/Time: 06/11/2022 9:45 AM  Performed by: Dionne Bucy, CRNAPre-anesthesia Checklist: Patient identified, Emergency Drugs available, Suction available, Patient being monitored and Timeout performed Patient Re-evaluated:Patient Re-evaluated prior to induction Oxygen Delivery Method: Nasal cannula Placement Confirmation: positive ETCO2

## 2022-06-11 NOTE — Anesthesia Preprocedure Evaluation (Signed)
Anesthesia Evaluation  Patient identified by MRN, date of birth, ID band Patient awake    Reviewed: Allergy & Precautions, H&P , NPO status   Airway Mallampati: II  TM Distance: >3 FB Neck ROM: full    Dental no notable dental hx.    Pulmonary neg pulmonary ROS, former smoker,    Pulmonary exam normal breath sounds clear to auscultation       Cardiovascular Exercise Tolerance: Good hypertension, negative cardio ROS   Rhythm:regular Rate:Normal     Neuro/Psych Seizures -,  PSYCHIATRIC DISORDERS Depression CVA (expressive aphasia, R side weakness), Residual Symptoms    GI/Hepatic negative GI ROS,   Endo/Other    Renal/GU CRFRenal disease     Musculoskeletal  (+) Arthritis ,   Abdominal   Peds  Hematology negative hematology ROS (+)   Anesthesia Other Findings   Reproductive/Obstetrics                             Anesthesia Physical  Anesthesia Plan  ASA: 3  Anesthesia Plan: MAC   Post-op Pain Management: Minimal or no pain anticipated   Induction:   PONV Risk Score and Plan: 1 and Midazolam, TIVA and Treatment may vary due to age or medical condition  Airway Management Planned: Nasal Cannula and Natural Airway  Additional Equipment:   Intra-op Plan:   Post-operative Plan:   Informed Consent: I have reviewed the patients History and Physical, chart, labs and discussed the procedure including the risks, benefits and alternatives for the proposed anesthesia with the patient or authorized representative who has indicated his/her understanding and acceptance.     Dental Advisory Given  Plan Discussed with: CRNA  Anesthesia Plan Comments:         Anesthesia Quick Evaluation

## 2022-06-11 NOTE — H&P (Signed)
[facility]   Primary Care Physician:  Milon Score Ophthalmologist: Dr. Deberah Pelton  Pre-Procedure History & Physical: HPI:  Mark Shepard is a 66 y.o. male here for cataract surgery.   Past Medical History:  Diagnosis Date   CKD (chronic kidney disease), stage III (HCC)    Depression    Expressive aphasia    S/P stroke. Communicates by writing.   Hyperlipidemia    Hypertension    Right sided weakness    S/P stroke   Salivary secretion disorder    Seizure (HCC)    Stroke [facility])     Past Surgical History:  Procedure Laterality Date   CATARACT EXTRACTION W/PHACO Right 04/24/2022   Procedure: CATARACT EXTRACTION PHACO AND INTRAOCULAR LENS PLACEMENT (IOC) RIGHT COMPLICATED HEALON 5 VISION BLUE OMIDRIA 19.64 02:01.5;  Surgeon: Estanislado Pandy, MD;  Location: [facility];  Service: Ophthalmology;  Laterality: Right;   IR REPLC GASTRO/COLONIC TUBE PERCUT W/FLUORO  09/25/2021    Prior to Admission medications   Medication Sig Start Date End Date Taking? Authorizing Provider  acetaminophen (TYLENOL) 325 MG tablet Take 650 mg by mouth every 4 (four) hours as needed for mild pain or moderate pain.   Yes [provider]  Amino Acids-Protein Hydrolys (FEEDING SUPPLEMENT, PRO-STAT SUGAR FREE 64,) LIQD Take 30 mLs by mouth 2 (two) times daily with a meal.   Yes [provider]  amLODipine (NORVASC) 10 MG tablet Take 10 mg by mouth daily.   Yes [provider]  aspirin 81 MG EC tablet Take 81 mg by mouth daily.   Yes [provider]  atorvastatin (LIPITOR) 80 MG tablet Take 40 mg by mouth every evening.   Yes [provider]  baclofen (LIORESAL) 10 MG tablet Take 10 mg by mouth 2 (two) times daily.   Yes [provider]  Carboxymethylcellulose Sodium (THERATEARS) 0.25 % SOLN Place 2 drops into both eyes 4 (four) times daily as needed (dry eyes).   Yes [provider]  carvedilol (COREG) 12.5 MG tablet Take  12.5 mg by mouth 2 (two) times daily with a meal.   Yes [provider]  diclofenac Sodium (VOLTAREN) 1 % GEL Apply 2 g topically 4 (four) times daily. (Apply to right shoulder)   Yes [provider]  FLUoxetine (PROZAC) 40 MG capsule Take 40 mg by mouth daily.   Yes [provider]  gabapentin (NEURONTIN) 100 MG capsule Take 300 mg by mouth 2 (two) times daily.   Yes [provider]  glycopyrrolate (ROBINUL) 1 MG tablet Take 1 mg by mouth 3 (three) times daily.   Yes [provider]  lidocaine (LIDODERM) 5 % Place 2 patches onto the skin daily. Remove & Discard patch within 12 hours or as directed by MD   Yes [provider]  lisinopril (ZESTRIL) 20 MG tablet Take 20 mg by mouth daily.   Yes [provider]  Multiple Vitamins-Minerals (THERA-M) TABS Take 1 tablet by mouth daily.   Yes [provider]  nitroGLYCERIN (NITROSTAT) 0.4 MG SL tablet Place 0.4 mg under the tongue every 5 (five) minutes as needed for chest pain.   Yes [provider]  oxybutynin (DITROPAN-XL) 5 MG 24 hr tablet Take 5 mg by mouth daily.   Yes [provider]  polyethylene glycol (MIRALAX / GLYCOLAX) 17 g packet Take 17 g by mouth daily.   Yes [provider]  scopolamine (TRANSDERM-SCOP) 1 MG/3DAYS Place 1 patch onto the skin  every 3 (three) days.   Yes [provider]  senna (SENOKOT) 8.6 MG TABS tablet Take 2 tablets by mouth at bedtime.   Yes [provider]  thiamine 100 MG tablet Take 100 mg by mouth daily.   Yes [provider]  valproic acid (DEPAKENE) 250 MG capsule Take 250 mg by mouth 4 (four) times daily.   Yes [provider]  Vitamin D, Ergocalciferol, (DRISDOL) 1.25 MG (50000 UNIT) CAPS capsule Take 50,000 Units by mouth every 30 (thirty) days. (First of the month)   Yes [provider]    Allergies as of 03/11/2022   (No Known Allergies)    History reviewed. No  pertinent family history.  Social History   Socioeconomic History   Marital status: Single    Spouse name: Not on file   Number of children: Not on file   Years of education: Not on file   Highest education level: Not on file  Occupational History   Not on file  Tobacco Use   Smoking status: Former   Smokeless tobacco: Never  Vaping Use   Vaping Use: Unknown  Substance and Sexual Activity   Alcohol use: Not Currently    Alcohol/week: 8.0 standard drinks of alcohol    Types: 2 Cans of beer, 6 Shots of liquor per week   Drug use: Not Currently   Sexual activity: Yes  Other Topics Concern   Not on file  Social History Narrative   Not on file   Social Determinants of Health   Financial Resource Strain: Not on file  Food Insecurity: Not on file  Transportation Needs: Not on file  Physical Activity: Not on file  Stress: Not on file  Social Connections: Not on file  Intimate Partner Violence: Not on file    Review of Systems: See HPI, otherwise negative ROS  Physical Exam: BP (!) 153/109   Pulse 67   Temp 97.9 F (36.6 C) (Temporal)   Resp 20   Wt 66.2 kg   SpO2 98%   BMI 23.57 kg/m  General:   Alert, cooperative in NAD Head:  Normocephalic and atraumatic. Respiratory:  Normal work of breathing. Cardiovascular:  RRR  Impression/Plan: Mark Shepard is here for cataract surgery.  Risks, benefits, limitations, and alternatives regarding cataract surgery have been reviewed with the patient.  Questions have been answered.  All parties agreeable.   Estanislado Pandy, MD  06/11/2022, 9:00 AM

## 2022-06-11 NOTE — Anesthesia Postprocedure Evaluation (Signed)
Anesthesia Post Note  Patient: Mark Shepard  Procedure(s) Performed: CATARACT EXTRACTION PHACO AND INTRAOCULAR LENS PLACEMENT ([place]) LEFT HEALON5 VISION BLUE [place] (Left: Eye)     Patient location during evaluation: PACU Anesthesia Type: MAC Level of consciousness: awake Pain management: pain level controlled Vital Signs Assessment: post-procedure vital signs reviewed and stable Respiratory status: respiratory function stable Cardiovascular status: stable Postop Assessment: no apparent nausea or vomiting Anesthetic complications: no   No notable events documented.  Veda Canning

## 2022-06-12 ENCOUNTER — Encounter: Payer: Self-pay | Admitting: Ophthalmology

## 2022-10-09 ENCOUNTER — Emergency Department: Payer: Medicare Other

## 2022-10-09 ENCOUNTER — Emergency Department
Admission: EM | Admit: 2022-10-09 | Discharge: 2022-10-09 | Disposition: A | Discharge status: Skilled Nursing Facility | Payer: Medicare Other | Attending: Emergency Medicine | Admitting: Emergency Medicine

## 2022-10-09 ENCOUNTER — Other Ambulatory Visit: Payer: Self-pay

## 2022-10-09 DIAGNOSIS — M79604 Pain in right leg: Secondary | ICD-10-CM | POA: Insufficient documentation

## 2022-10-09 DIAGNOSIS — N189 Chronic kidney disease, unspecified: Secondary | ICD-10-CM | POA: Insufficient documentation

## 2022-10-09 DIAGNOSIS — I129 Hypertensive chronic kidney disease with stage 1 through stage 4 chronic kidney disease, or unspecified chronic kidney disease: Secondary | ICD-10-CM | POA: Diagnosis not present

## 2022-10-09 DIAGNOSIS — Z20822 Contact with and (suspected) exposure to covid-19: Secondary | ICD-10-CM | POA: Insufficient documentation

## 2022-10-09 DIAGNOSIS — G8929 Other chronic pain: Secondary | ICD-10-CM | POA: Diagnosis not present

## 2022-10-09 DIAGNOSIS — M79605 Pain in left leg: Secondary | ICD-10-CM | POA: Diagnosis not present

## 2022-10-09 DIAGNOSIS — I69354 Hemiplegia and hemiparesis following cerebral infarction affecting left non-dominant side: Secondary | ICD-10-CM | POA: Insufficient documentation

## 2022-10-09 LAB — COMPREHENSIVE METABOLIC PANEL
ALT: 16 U/L (ref 0–44)
AST: 17 U/L (ref 15–41)
Albumin: 3.5 g/dL (ref 3.5–5.0)
Alkaline Phosphatase: 45 U/L (ref 38–126)
Anion gap: 6 (ref 5–15)
BUN: 30 mg/dL — ABNORMAL HIGH (ref 8–23)
CO2: 24 mmol/L (ref 22–32)
Calcium: 9.4 mg/dL (ref 8.9–10.3)
Chloride: 108 mmol/L (ref 98–111)
Creatinine, Ser: 1.2 mg/dL (ref 0.61–1.24)
GFR, Estimated: >60 mL/min (ref >60)
Glucose, Bld: 94 mg/dL (ref 70–99)
Potassium: 4.2 mmol/L (ref 3.5–5.1)
Sodium: 138 mmol/L (ref 135–145)
Total Bilirubin: 0.6 mg/dL (ref 0.3–1.2)
Total Protein: 6.8 g/dL (ref 6.5–8.1)

## 2022-10-09 LAB — URINALYSIS, ROUTINE W REFLEX MICROSCOPIC
Bilirubin Urine: NEGATIVE
Glucose, UA: NEGATIVE mg/dL
Hgb urine dipstick: NEGATIVE
Ketones, ur: NEGATIVE mg/dL
Leukocytes,Ua: NEGATIVE
Nitrite: NEGATIVE
Protein, ur: NEGATIVE mg/dL
Specific Gravity, Urine: 1.016 (ref 1.005–1.030)
pH: 6 (ref 5.0–8.0)

## 2022-10-09 LAB — CBC
HCT: 36.2 % — ABNORMAL LOW (ref 39.0–52.0)
Hemoglobin: 12.2 g/dL — ABNORMAL LOW (ref 13.0–17.0)
MCH: 29.8 pg (ref 26.0–34.0)
MCHC: 33.7 g/dL (ref 30.0–36.0)
MCV: 88.5 fL (ref 80.0–100.0)
Platelets: 311 10*3/uL (ref 150–400)
RBC: 4.09 MIL/uL — ABNORMAL LOW (ref 4.22–5.81)
RDW: 12.9 % (ref 11.5–15.5)
WBC: 7.1 10*3/uL (ref 4.0–10.5)
nRBC: 0 % (ref 0.0–0.2)

## 2022-10-09 LAB — RESP PANEL BY RT-PCR (FLU A&B, COVID) ARPGX2
Influenza A by PCR: NEGATIVE
Influenza B by PCR: NEGATIVE
SARS Coronavirus 2 by RT PCR: NEGATIVE

## 2022-10-09 LAB — CK: Total CK: 88 U/L (ref 49–397)

## 2022-10-09 MED ORDER — METHADONE HCL 10 MG PO TABS
5.0000 mg | ORAL_TABLET | ORAL | Status: AC
  Administered 2022-10-09: 5 mg via ORAL
  Filled 2022-10-09: qty 1

## 2022-10-09 MED ORDER — GABAPENTIN 600 MG PO TABS
300.0000 mg | ORAL_TABLET | ORAL | Status: AC
Start: 2022-10-09 — End: 2022-10-09
  Administered 2022-10-09: 300 mg via ORAL
  Filled 2022-10-09: qty 1

## 2022-10-09 MED ORDER — TIZANIDINE HCL 2 MG PO TABS
2.0000 mg | ORAL_TABLET | ORAL | Status: AC
Start: 2022-10-09 — End: 2022-10-09
  Administered 2022-10-09: 2 mg via ORAL
  Filled 2022-10-09: qty 1

## 2022-10-09 MED ORDER — SODIUM CHLORIDE 0.9 % IV BOLUS
1000.0000 mL | Freq: Once | INTRAVENOUS | Status: AC
  Administered 2022-10-09: 1000 mL via INTRAVENOUS

## 2022-10-09 NOTE — ED Provider Notes (Signed)
[facility] Provider Note    Event Date/Time   First MD Initiated Contact with Patient 10/09/22 1248     (approximate)   History   Chief Complaint: Weakness   HPI  Mark Shepard is a 66 y.o. male with a history of stroke with resulting left-sided hemiplegia, hypertension, CKD, chronic pain who was sent to the ED from [facility] due to concern about increased left-sided weakness compared to baseline.  Patient denies any increased weakness but does complain of bilateral leg pain in the thighs.  No falls or new injuries.  No chest pain shortness of breath or vomiting.     Physical Exam   Triage Vital Signs: ED Triage Vitals  Enc Vitals Group     BP 10/09/22 1251 (!) 120/90     Pulse Rate 10/09/22 1251 (!) 59     Resp 10/09/22 1251 18     Temp 10/09/22 1253 (!) 97.5 F (36.4 C)     Temp Source 10/09/22 1253 Oral     SpO2 10/09/22 1251 100 %     Weight 10/09/22 1252 150 lb (68 kg)     Height 10/09/22 1252 5\' 6"  (1.676 m)     Head Circumference --      [facility] --      Pain Score --      Pain Loc --      Pain Edu? --      Excl. in GC? --     Most recent vital signs: Vitals:   10/09/22 1400 10/09/22 1430  BP: (!) 117/103 (!) 142/88  Pulse: 70 73  Resp: (!) 22 (!) 22  Temp:    SpO2: 98% 99%    General: Awake, no distress.  Breathing comfortably.  Uncontrolled salivation which is chronic CV:  Good peripheral perfusion.  Regular rate and rhythm Resp:  Normal effort.  Clear to auscultation bilaterally Abd:  No distention.  Soft nontender Other:  Tenderness in bilateral proximal thigh musculature.  No bony deformity or wounds.  No inflammatory skin changes.  Intact passive range of motion with spasticity.  Dry oral mucosa.   ED Results / Procedures / Treatments   Labs (all labs ordered are listed, but only abnormal results are displayed) Labs Reviewed  CBC - Abnormal; Notable for the following components:      Result Value   RBC 4.09  (*)    Hemoglobin 12.2 (*)    HCT 36.2 (*)    All other components within normal limits  URINALYSIS, ROUTINE W REFLEX MICROSCOPIC - Abnormal; Notable for the following components:   Color, Urine YELLOW (*)    APPearance CLEAR (*)    All other components within normal limits  COMPREHENSIVE METABOLIC PANEL - Abnormal; Notable for the following components:   BUN 30 (*)    All other components within normal limits  RESP PANEL BY RT-PCR (FLU A&B, COVID) ARPGX2  CK     EKG    RADIOLOGY Chest x-ray interpreted by me, appears normal.  Radiology report reviewed.  X-ray right femur unremarkable.  X-ray left femur unremarkable.   PROCEDURES:  Procedures   MEDICATIONS ORDERED IN ED: Medications  gabapentin (NEURONTIN) tablet 300 mg (has no administration in time range)  methadone (DOLOPHINE) tablet 5 mg (has no administration in time range)  tiZANidine (ZANAFLEX) tablet 2 mg (has no administration in time range)  sodium chloride 0.9 % bolus 1,000 mL (1,000 mLs Intravenous New Bag/Given 10/09/22 1348)  IMPRESSION / MDM / ASSESSMENT AND PLAN / ED COURSE  I reviewed the triage vital signs and the nursing notes.                              Differential diagnosis includes, but is not limited to, femur fracture, chronic pain, electrolyte abnormality, anemia, AKI, rhabdomyolysis, UTI, dehydration, viral illness  Patient's presentation is most consistent with severe exacerbation of chronic illness.  Patient presents with symmetric pain in bilateral legs.  Most likely chronic pain related.  Vital signs are unremarkable.  X-rays and labs are all unremarkable.  Patient is able to move himself around the stretcher at baseline, pull himself upright and shift his position.  He agrees that symptoms appear to be chronic pain related.  I given a dose of his gabapentin methadone and tizanidine here and plan for discharge.  I do not see any reason to suspect infection, fracture, dislocation,  vascular occlusion, or stroke.       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   Carrie Mew, MD 10/09/22 1520

## 2022-10-09 NOTE — Discharge Instructions (Addendum)
Your lab tests and xrays today were all okay.  Your leg pain symptoms appear to be related to your chronic pain and spasm condition.  We gave a dose of your gabapentin, methadone, and tizanidine at 3:20pm to help with this. Please follow up with your doctor for further evaluation.

## 2022-10-09 NOTE — ED Triage Notes (Signed)
Pt arrives via EMS from [facility] where staff states he is weaker than normal on his L side when they checked on him at noon- per EMS grip strength and movement of L side is equal but that pt is complaining of pain in his legs- pt is nonverbal at baseline but is usually able to write responses- pt does have hx of stroke

## 2023-02-07 IMAGING — XA IR REPLACE G-TUBE/COLONIC TUBE
1 series · 9 of 9 positions shown · non-contrast
Comparison: None.

INDICATION: History of dysphagia with chronic feeding 22 French gastrostomy
tube, initially placed at an outside institution. Patient's
gastrostomy tube was inadvertently removed as healthcare facility
inter presents today for fluoroscopic guided replacement.

EXAM:
FLUOROSCOPIC GUIDED REPLACEMENT OF GASTROSTOMY TUBE

[Series 2: interv standard · 3 acquisitions, 9 frames shown]
[im 1/3]
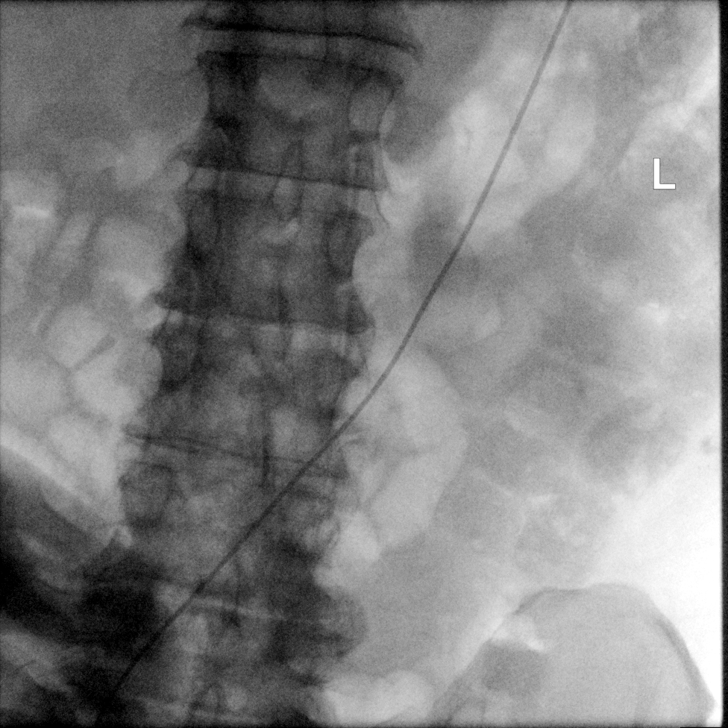
[im 1/3]
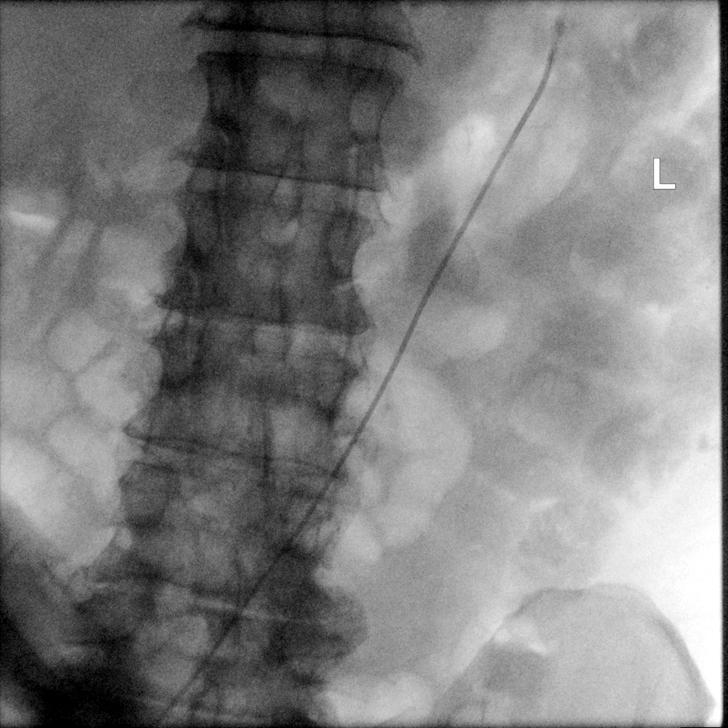
[im 1/3]
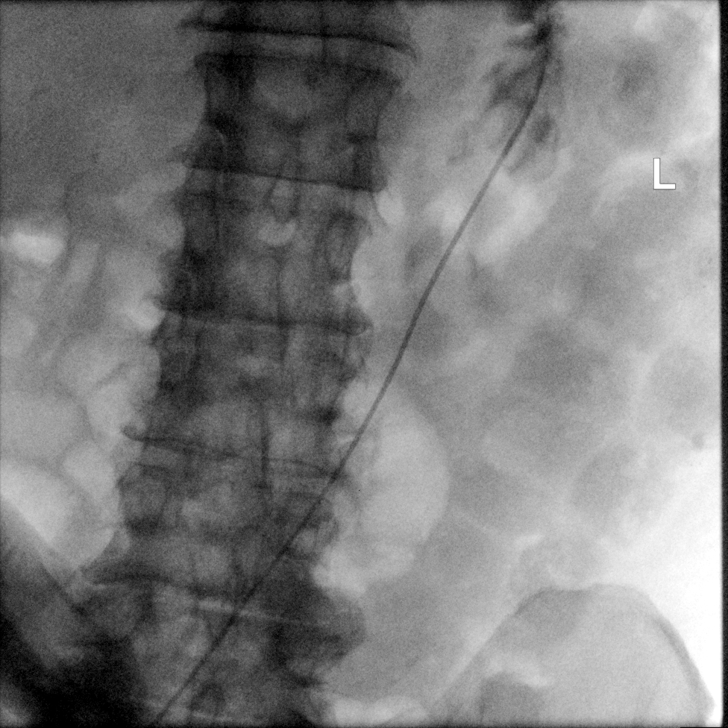
[im 1/3]
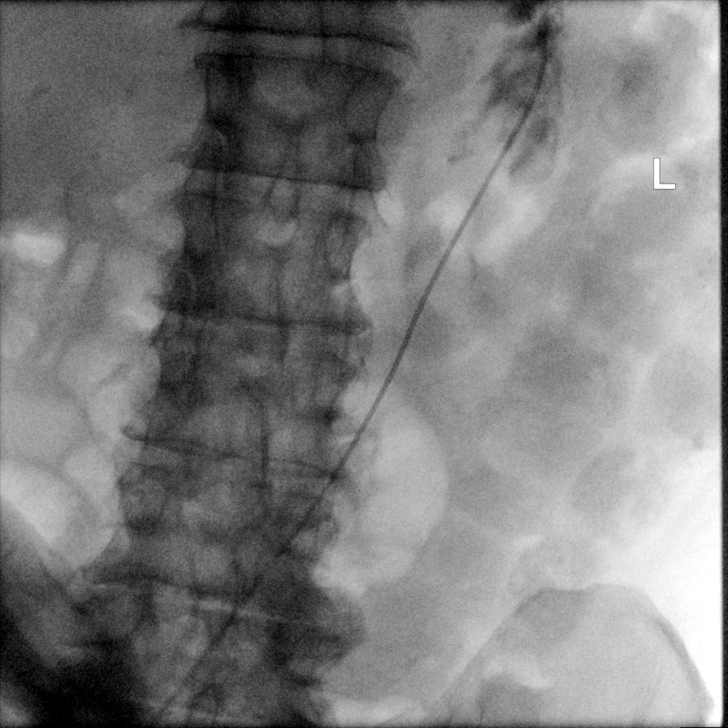
[im 2/3]
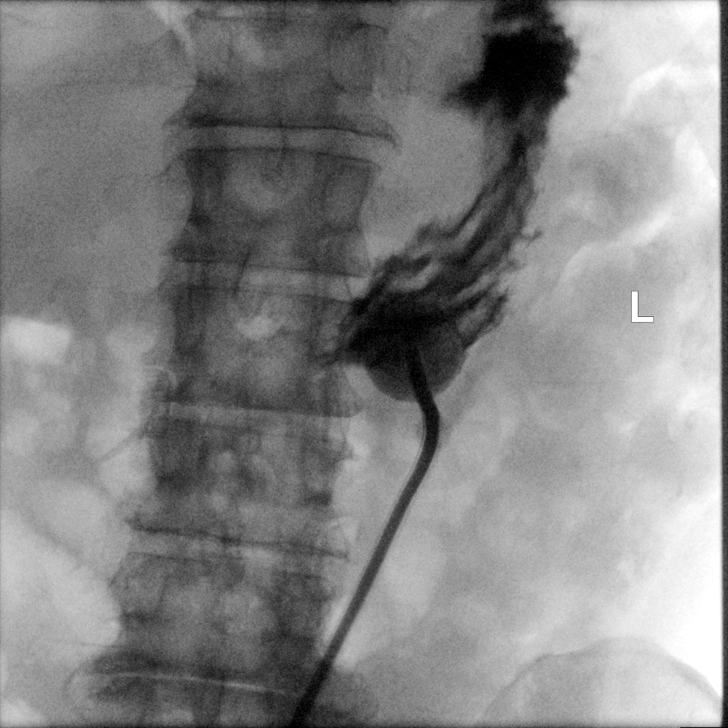
[im 3/3]
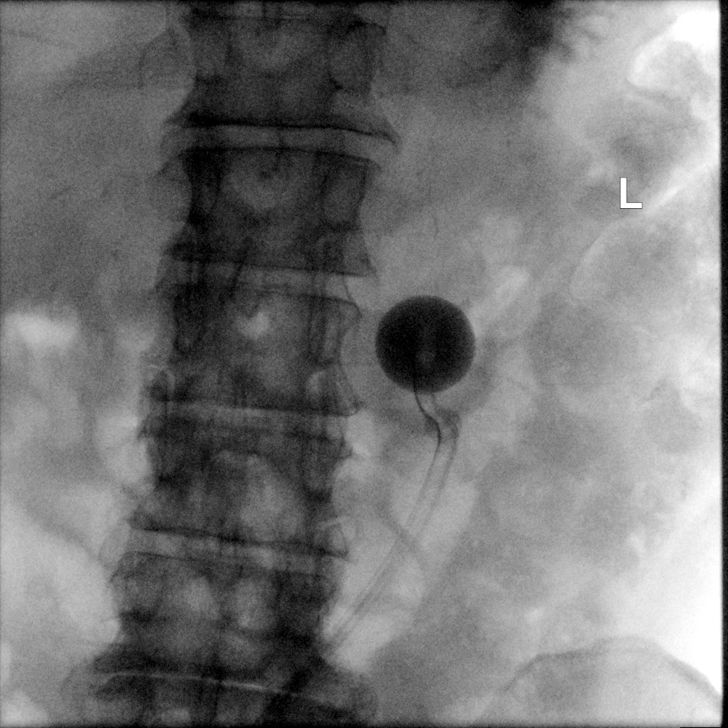
[im 3/3]
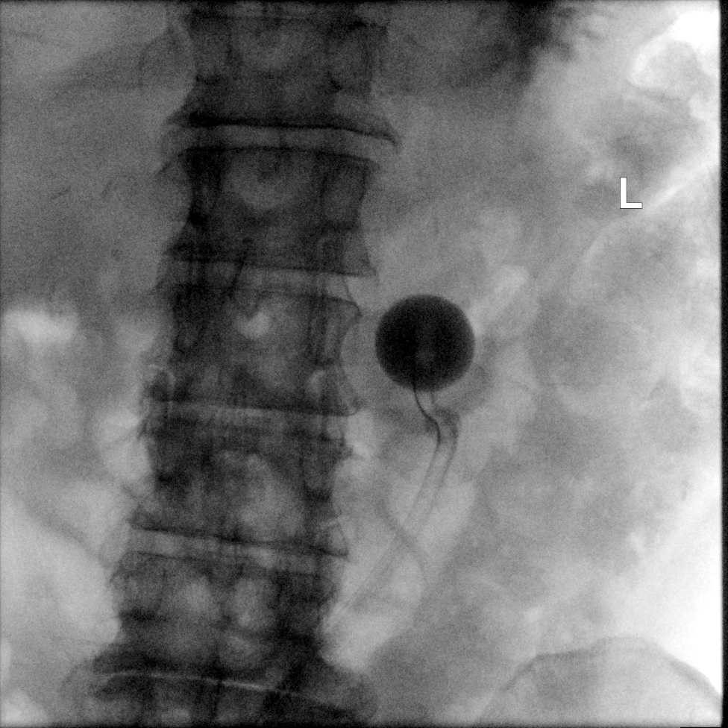
[im 3/3]
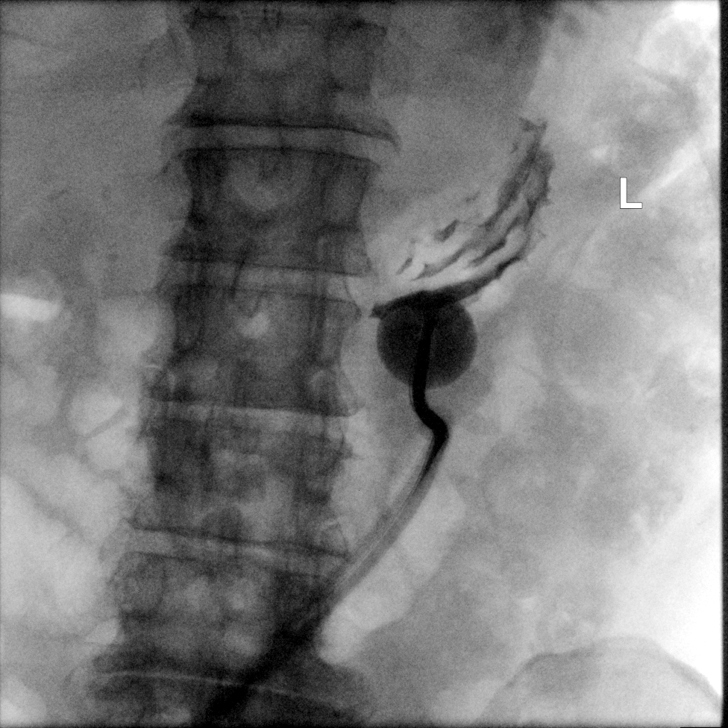
[im 3/3]
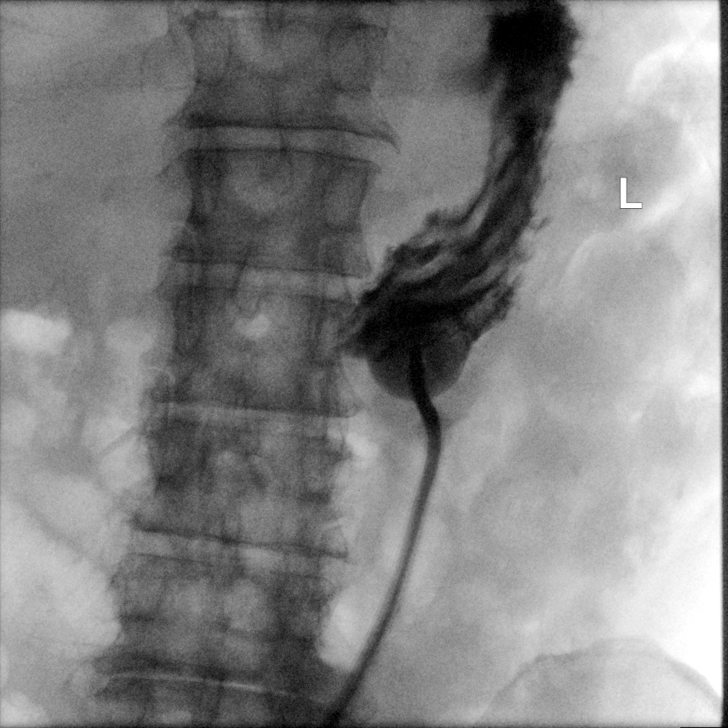

[9 of 9 positions shown; findings below may reference images not displayed]

MEDICATIONS:
None.

CONTRAST:  20mL OMNIPAQUE IOHEXOL 350 MG/ML SOLN - administered into
the gastric lumen

FLUOROSCOPY TIME:  12 seconds (1.5 mGy)

COMPLICATIONS:
None immediate.

PROCEDURE:
Informed written consent was obtained from the patient after a
discussion of the risks, benefits and alternatives to treatment.
Questions regarding the procedure were encouraged and answered. A
timeout was performed prior to the initiation of the procedure.

The upper abdomen and external portion of the existing gastrostomy
tube was prepped and draped in the usual sterile fashion, and a
sterile drape was applied covering the operative field. Maximum
barrier sterile technique with sterile gowns and gloves were used
for the procedure. A timeout was performed prior to the initiation
of the procedure.

The site of the pre-existing gastrostomy tube was cannulated with a
short Amplatz wire advanced through the track to the level of the
gastric lumen. Contrast injection confirmed appropriate intraluminal
positioning

Next, under intermittent fluoroscopic guidance, the track was
dilated allowing placement of

a new 20-French balloon inflatable gastrostomy tube. The balloon was
inflated with saline and dilute contrast and pulled against the
anterior inner lumen of the stomach and the external disc was
cinched.

Contrast was injected and a post procedural spot fluoroscopic image
was obtained confirming appropriate positioning and functionality of
the new gastrostomy tube. A dressing was applied. The patient
tolerated the procedure well without immediate postprocedural
complication.
IMPRESSION: Successful fluoroscopic guided replacement of a new 20-French
gastrostomy tube. The gastrostomy tube is ready for immediate use.

## 2023-05-17 IMAGING — CT CT CHEST W/O CM
1 series · 13 of 34 positions shown, 17 images · non-contrast
Comparison: None.

CLINICAL DATA: Pulmonary vascular congestion versus pulmonary
arterial hypertension. Study notes indicate basilar perihilar
fullness but we do not have prior chest CTs or chest x-rays for
comparison.



[Series 2: thorax · axial · 0.70mm/px · z∈[-805,-503]mm · 13 of 179 slices shown, 17 images]
[im 14/179  mediastinal]
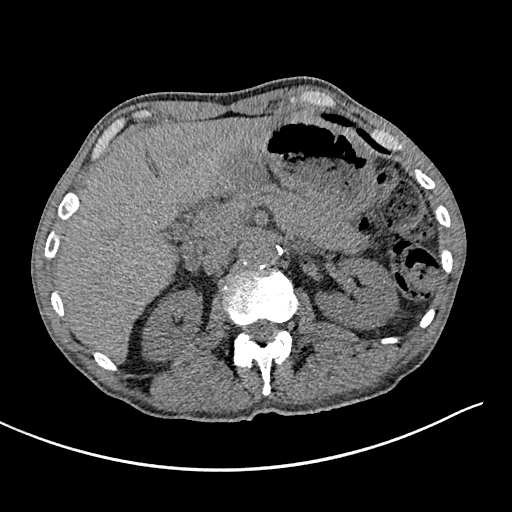
[im 14/179  lung]
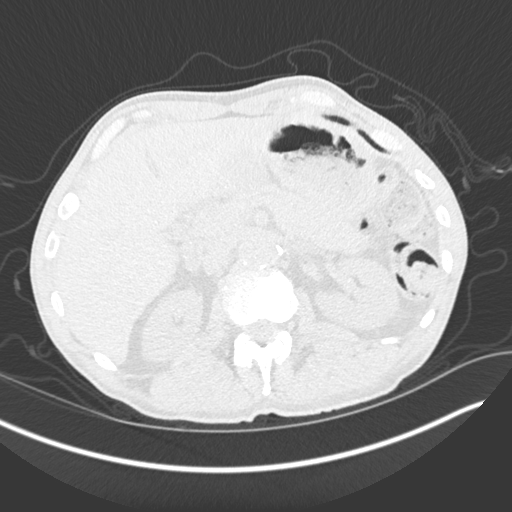
[im 27/179  lung]
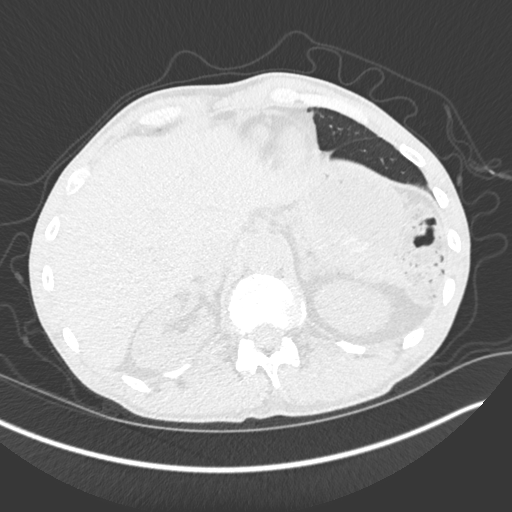
[im 40/179  lung]
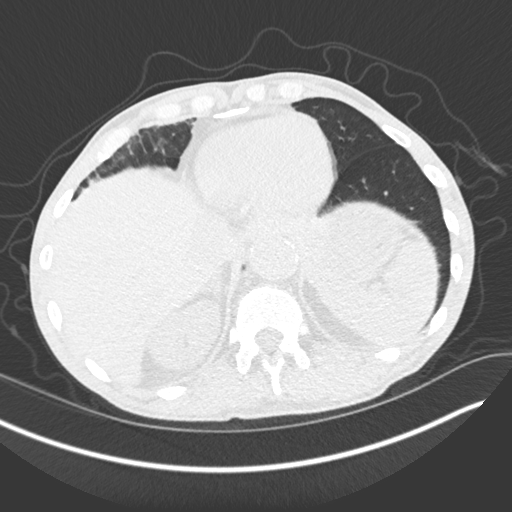
[im 53/179  lung]
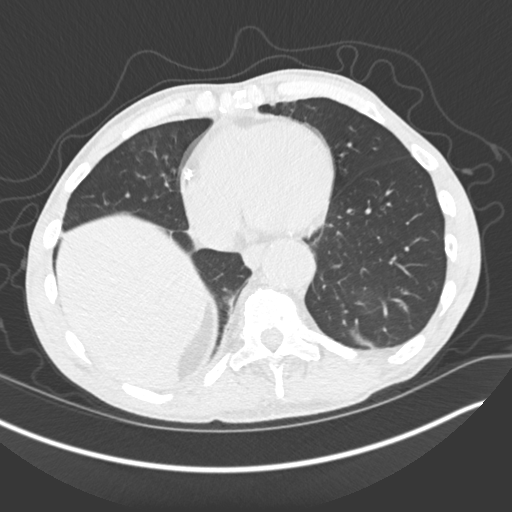
[im 72/179  mediastinal]
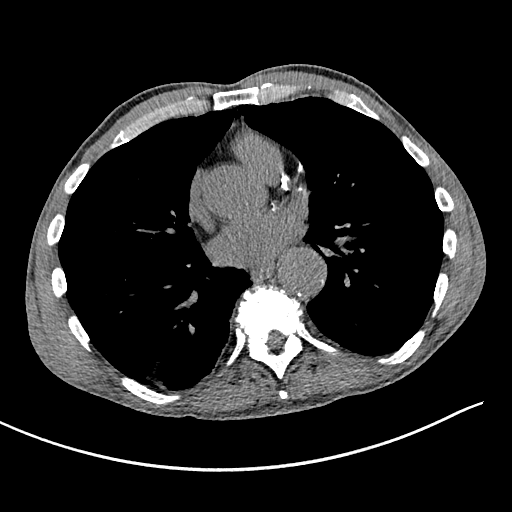
[im 72/179  lung]
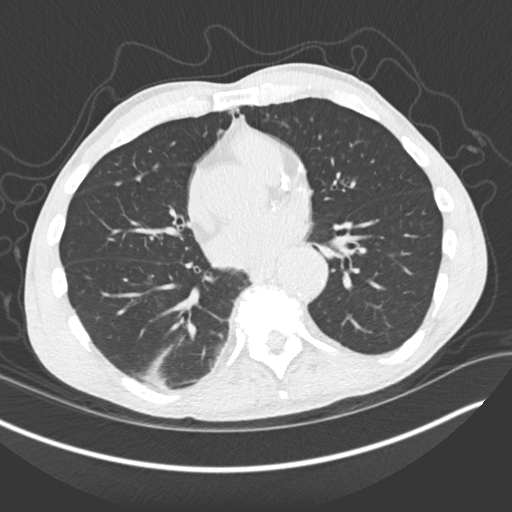
[im 80/179  lung]
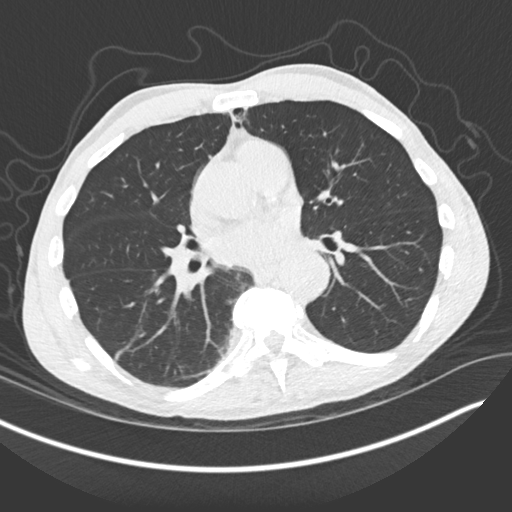
[im 93/179  lung]
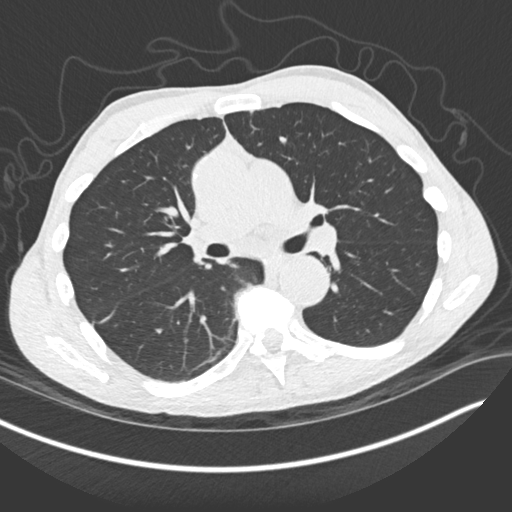
[im 99/179  lung]
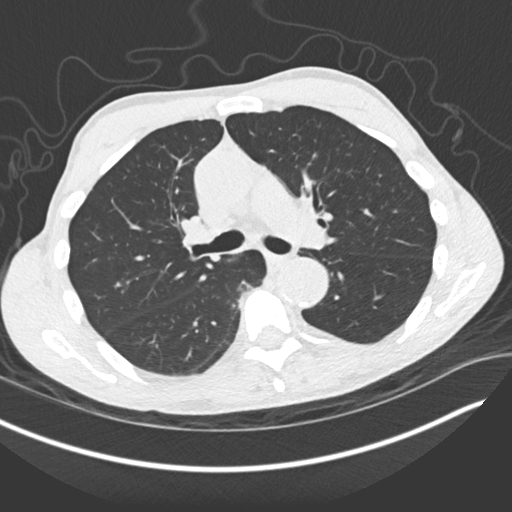
[im 107/179  mediastinal]
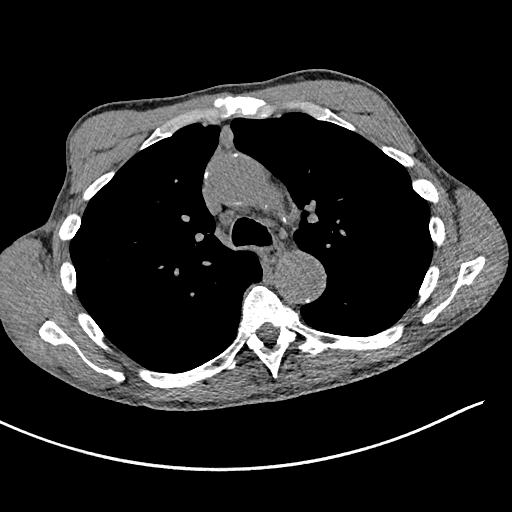
[im 107/179  lung]
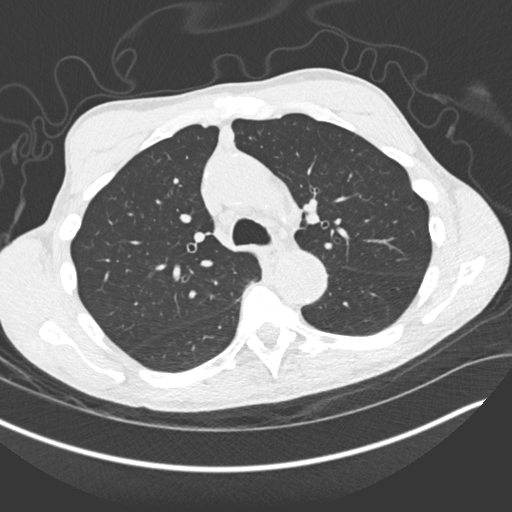
[im 126/179  lung]
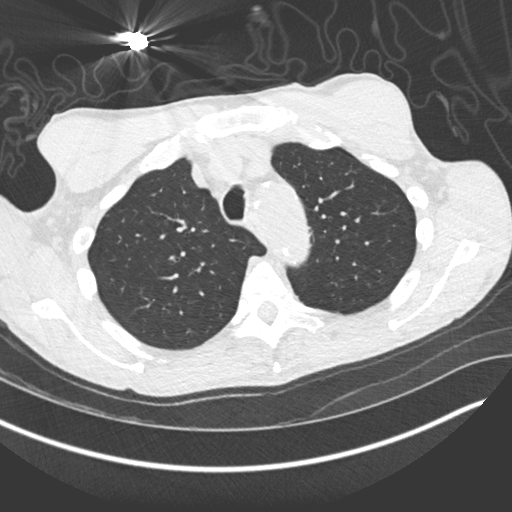
[im 139/179  lung]
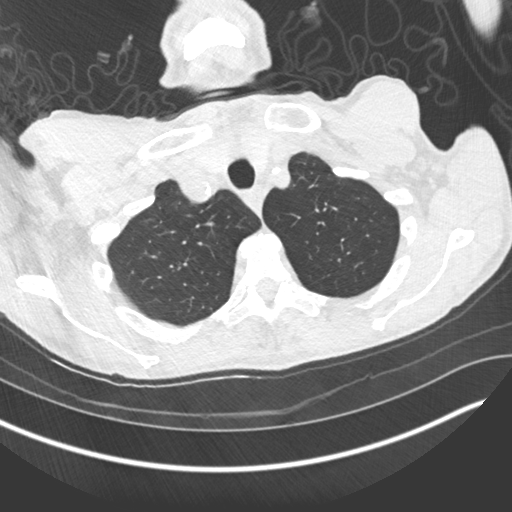
[im 152/179  lung]
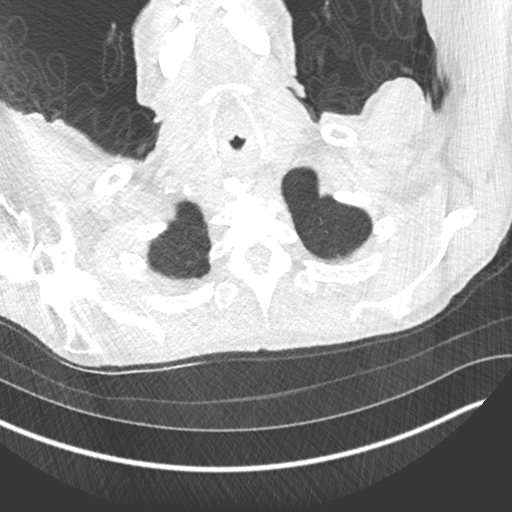
[im 165/179  mediastinal]
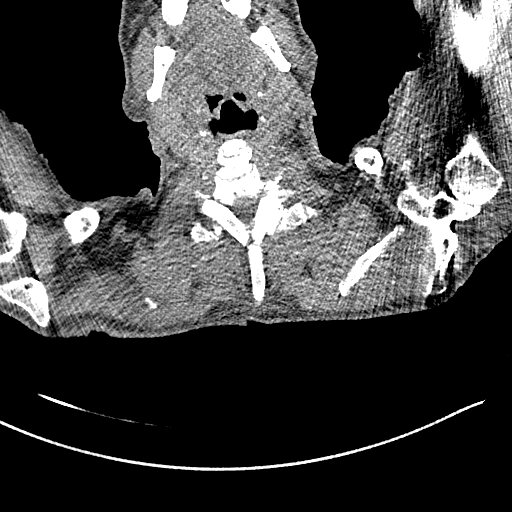
[im 165/179  lung]
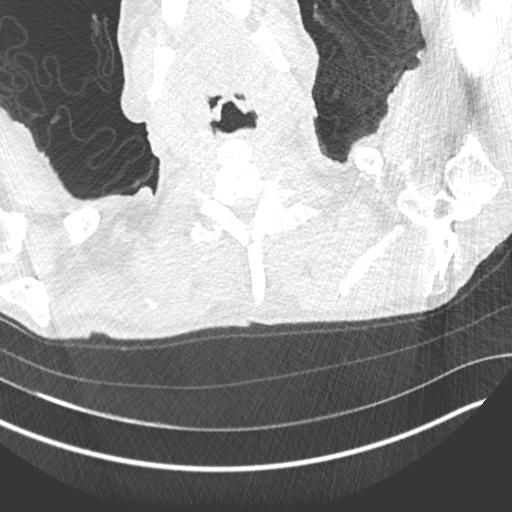

[13 of 34 positions shown; findings below may reference images not displayed]

FINDINGS: Cardiovascular: The cardiac size is normal. There is a small
anterior pericardial effusion. Coronary arteries are heavily
calcified. The pulmonary trunk slightly prominent 3.1 cm, main
arteries were within normal caliber limits.

There is aortic ectasia, tortuosity and moderate patchy
calcification with calcification extending into the branch arteries.
There is a slightly aneurysmal aortic root measuring 4.0 cm,
borderline aneurysmal ascending and arch segments both measuring
cm with the descending segment tortuous measuring up to 3.5 cm. The
pulmonary veins are normal caliber.

Mediastinum/Nodes: There are shotty up to borderline size lymph
nodes in the prevascular, precarinal and subcarinal areas of the
mediastinum but no enlarged intrathoracic or axillary lymph nodes
are visible without contrast.

The thyroid gland is unremarkable. There are retained secretions
along the right wall of the distal trachea with the trachea
otherwise unremarkable. No esophageal mass is seen.

Lungs/Pleura: The lungs are slightly emphysematous with early
centrilobular changes in the upper lobes mild pulmonary
overinflation, and a few paramediastinal subpleural blebs in the and
Bv medial aspect of the left upper lobe.

Subpleural reticulation and bronchiolectasis are noted locally in
the base of the right middle lobe anteriorly and not seen elsewhere.
Slightly above this, in the medial segment of the right middle lobe
there are tree-in-bud interstitial changes.

No other interstitial or airspace infiltrates are seen. There is
linear scarring versus atelectasis in the posterior lower lobes. Two
nodules each measuring 7 mm are noted, 1 in the right upper lobe on
axial 62 of series 3, the other in the right middle lobe on axial
110, with a 5 mm right lower lobe nodule noted on axial 86 and a 4
mm fissural based nodule in the right middle lobe on axial 108. A 3
mm noncalcified nodule is noted in the left lower lobe on image 99.

Remainder of the lungs are clear. There is no pleural effusion,
thickening or pneumothorax. Mild elevation right hemidiaphragm.

Upper Abdomen: No acute abnormality. There is a PEG tube in the
stomach. There is suprarenal aortic calcification.

Musculoskeletal: There is mild osteopenia and degenerative change of
the spine. Multilevel degenerative discs with marginal
osteophytosis. Advanced degenerative change of the cervical spine.
IMPRESSION: 1. Coronary artery and aortic atherosclerosis.
2. 4 cm slightly aneurysmal aortic root and borderline aneurysmal
arch and ascending segments, with ectasia in the descending segment.
Baseline CTA chest or MRA recommended, and yearly imaging
surveillance follow-up will likely be needed.
3. Shotty up to borderline sized prevascular, precarinal and
subcarinal lymph nodes. No bulky adenopathy.
4. Small pericardial effusion.
5. Early COPD change.
6. Fibrotic changes in the anterior base of the right middle lobe,
not seen elsewhere, probably reflecting post inflammatory/pneumonic
scarring, with additional right middle lobe tree-in-bud interstitial
changes in keeping with small airways disease or pneumonitis versus
aspiration.
7. 4 nodules on the right, 2 of which measure 7 mm. Recommend a
noncontrast chest CT at 3-6 months, then another noncontrast chest
CT at 18-24 months. These guidelines do not apply to
immunocompromised patients and patients with cancer. F/U in patients
with significant comorbidities as clinically warranted. For lung
cancer screening, adhere to Lung-RADS guidelines. Reference:

## 2024-03-27 ENCOUNTER — Observation Stay

## 2024-03-27 ENCOUNTER — Emergency Department

## 2024-03-27 ENCOUNTER — Inpatient Hospital Stay
Admission: EM | Admit: 2024-03-27 | Discharge: 2024-03-31 | DRG: 154 | Disposition: A | Discharge status: Other Acute Inpatient Hospital | Source: Skilled Nursing Facility | Attending: Internal Medicine | Admitting: Internal Medicine

## 2024-03-27 ENCOUNTER — Encounter: Payer: Self-pay | Admitting: Internal Medicine

## 2024-03-27 ENCOUNTER — Other Ambulatory Visit: Payer: Self-pay

## 2024-03-27 DIAGNOSIS — Z7401 Bed confinement status: Secondary | ICD-10-CM

## 2024-03-27 DIAGNOSIS — D72829 Elevated white blood cell count, unspecified: Secondary | ICD-10-CM

## 2024-03-27 DIAGNOSIS — Z7982 Long term (current) use of aspirin: Secondary | ICD-10-CM

## 2024-03-27 DIAGNOSIS — N1831 Chronic kidney disease, stage 3a: Secondary | ICD-10-CM | POA: Diagnosis present

## 2024-03-27 DIAGNOSIS — Z87891 Personal history of nicotine dependence: Secondary | ICD-10-CM

## 2024-03-27 DIAGNOSIS — Z681 Body mass index (BMI) 19 or less, adult: Secondary | ICD-10-CM

## 2024-03-27 DIAGNOSIS — R569 Unspecified convulsions: Secondary | ICD-10-CM | POA: Diagnosis present

## 2024-03-27 DIAGNOSIS — Z66 Do not resuscitate: Secondary | ICD-10-CM | POA: Diagnosis present

## 2024-03-27 DIAGNOSIS — I69391 Dysphagia following cerebral infarction: Secondary | ICD-10-CM

## 2024-03-27 DIAGNOSIS — K1120 Sialoadenitis, unspecified: Principal | ICD-10-CM | POA: Diagnosis present

## 2024-03-27 DIAGNOSIS — R4182 Altered mental status, unspecified: Secondary | ICD-10-CM | POA: Diagnosis not present

## 2024-03-27 DIAGNOSIS — F32A Depression, unspecified: Secondary | ICD-10-CM | POA: Diagnosis present

## 2024-03-27 DIAGNOSIS — F1490 Cocaine use, unspecified, uncomplicated: Secondary | ICD-10-CM | POA: Diagnosis present

## 2024-03-27 DIAGNOSIS — E785 Hyperlipidemia, unspecified: Secondary | ICD-10-CM | POA: Diagnosis present

## 2024-03-27 DIAGNOSIS — E43 Unspecified severe protein-calorie malnutrition: Secondary | ICD-10-CM | POA: Diagnosis present

## 2024-03-27 DIAGNOSIS — I69320 Aphasia following cerebral infarction: Secondary | ICD-10-CM

## 2024-03-27 DIAGNOSIS — Z1152 Encounter for screening for COVID-19: Secondary | ICD-10-CM

## 2024-03-27 DIAGNOSIS — E876 Hypokalemia: Secondary | ICD-10-CM | POA: Diagnosis not present

## 2024-03-27 DIAGNOSIS — R1312 Dysphagia, oropharyngeal phase: Secondary | ICD-10-CM

## 2024-03-27 DIAGNOSIS — I10 Essential (primary) hypertension: Secondary | ICD-10-CM | POA: Diagnosis present

## 2024-03-27 DIAGNOSIS — N390 Urinary tract infection, site not specified: Secondary | ICD-10-CM | POA: Diagnosis present

## 2024-03-27 DIAGNOSIS — Z8659 Personal history of other mental and behavioral disorders: Secondary | ICD-10-CM

## 2024-03-27 DIAGNOSIS — M5416 Radiculopathy, lumbar region: Secondary | ICD-10-CM | POA: Diagnosis present

## 2024-03-27 DIAGNOSIS — I429 Cardiomyopathy, unspecified: Secondary | ICD-10-CM

## 2024-03-27 DIAGNOSIS — I129 Hypertensive chronic kidney disease with stage 1 through stage 4 chronic kidney disease, or unspecified chronic kidney disease: Secondary | ICD-10-CM | POA: Diagnosis present

## 2024-03-27 DIAGNOSIS — I619 Nontraumatic intracerebral hemorrhage, unspecified: Secondary | ICD-10-CM | POA: Diagnosis present

## 2024-03-27 DIAGNOSIS — R1310 Dysphagia, unspecified: Secondary | ICD-10-CM | POA: Diagnosis present

## 2024-03-27 DIAGNOSIS — I639 Cerebral infarction, unspecified: Secondary | ICD-10-CM | POA: Diagnosis present

## 2024-03-27 DIAGNOSIS — R220 Localized swelling, mass and lump, head: Secondary | ICD-10-CM | POA: Diagnosis present

## 2024-03-27 DIAGNOSIS — Z79899 Other long term (current) drug therapy: Secondary | ICD-10-CM

## 2024-03-27 DIAGNOSIS — E870 Hyperosmolality and hypernatremia: Secondary | ICD-10-CM | POA: Diagnosis present

## 2024-03-27 DIAGNOSIS — E871 Hypo-osmolality and hyponatremia: Secondary | ICD-10-CM | POA: Diagnosis present

## 2024-03-27 DIAGNOSIS — Z8249 Family history of ischemic heart disease and other diseases of the circulatory system: Secondary | ICD-10-CM

## 2024-03-27 DIAGNOSIS — R531 Weakness: Secondary | ICD-10-CM | POA: Diagnosis present

## 2024-03-27 DIAGNOSIS — I428 Other cardiomyopathies: Secondary | ICD-10-CM | POA: Diagnosis present

## 2024-03-27 DIAGNOSIS — I69351 Hemiplegia and hemiparesis following cerebral infarction affecting right dominant side: Secondary | ICD-10-CM

## 2024-03-27 LAB — URINALYSIS, ROUTINE W REFLEX MICROSCOPIC
Bilirubin Urine: NEGATIVE
Glucose, UA: NEGATIVE mg/dL
Ketones, ur: NEGATIVE mg/dL
Leukocytes,Ua: NEGATIVE
Nitrite: NEGATIVE
Protein, ur: NEGATIVE mg/dL
Specific Gravity, Urine: 1.024 (ref 1.005–1.030)
pH: 5 (ref 5.0–8.0)

## 2024-03-27 LAB — CBC WITH DIFFERENTIAL/PLATELET
Abs Immature Granulocytes: 0.15 10*3/uL — ABNORMAL HIGH (ref 0.00–0.07)
Basophils Absolute: 0.1 10*3/uL (ref 0.0–0.1)
Basophils Relative: 0 %
Eosinophils Absolute: 0.1 10*3/uL (ref 0.0–0.5)
Eosinophils Relative: 1 %
HCT: 38.5 % — ABNORMAL LOW (ref 39.0–52.0)
Hemoglobin: 12.2 g/dL — ABNORMAL LOW (ref 13.0–17.0)
Immature Granulocytes: 1 %
Lymphocytes Relative: 16 %
Lymphs Abs: 2.5 10*3/uL (ref 0.7–4.0)
MCH: 30.3 pg (ref 26.0–34.0)
MCHC: 31.7 g/dL (ref 30.0–36.0)
MCV: 95.5 fL (ref 80.0–100.0)
Monocytes Absolute: 1.6 10*3/uL — ABNORMAL HIGH (ref 0.1–1.0)
Monocytes Relative: 10 %
Neutro Abs: 11.6 10*3/uL — ABNORMAL HIGH (ref 1.7–7.7)
Neutrophils Relative %: 72 %
Platelets: 352 10*3/uL (ref 150–400)
RBC: 4.03 MIL/uL — ABNORMAL LOW (ref 4.22–5.81)
RDW: 14.1 % (ref 11.5–15.5)
WBC: 16 10*3/uL — ABNORMAL HIGH (ref 4.0–10.5)
nRBC: 0 % (ref 0.0–0.2)

## 2024-03-27 LAB — COMPREHENSIVE METABOLIC PANEL WITH GFR
ALT: 27 U/L (ref 0–44)
AST: 25 U/L (ref 15–41)
Albumin: 2.4 g/dL — ABNORMAL LOW (ref 3.5–5.0)
Alkaline Phosphatase: 45 U/L (ref 38–126)
Anion gap: 8 (ref 5–15)
BUN: 43 mg/dL — ABNORMAL HIGH (ref 8–23)
CO2: 26 mmol/L (ref 22–32)
Calcium: 8.7 mg/dL — ABNORMAL LOW (ref 8.9–10.3)
Chloride: 110 mmol/L (ref 98–111)
Creatinine, Ser: 1.4 mg/dL — ABNORMAL HIGH (ref 0.61–1.24)
GFR, Estimated: 56 mL/min — ABNORMAL LOW (ref >60)
Glucose, Bld: 77 mg/dL (ref 70–99)
Potassium: 3.9 mmol/L (ref 3.5–5.1)
Sodium: 144 mmol/L (ref 135–145)
Total Bilirubin: 0.6 mg/dL (ref 0.0–1.2)
Total Protein: 6.7 g/dL (ref 6.5–8.1)

## 2024-03-27 LAB — RESP PANEL BY RT-PCR (RSV, FLU A&B, COVID)  RVPGX2
Influenza A by PCR: NEGATIVE
Influenza B by PCR: NEGATIVE
Resp Syncytial Virus by PCR: NEGATIVE
SARS Coronavirus 2 by RT PCR: NEGATIVE

## 2024-03-27 LAB — PROCALCITONIN: Procalcitonin: 0.19 ng/mL

## 2024-03-27 LAB — LACTIC ACID, PLASMA
Lactic Acid, Venous: 1.1 mmol/L (ref 0.5–1.9)
Lactic Acid, Venous: 1.2 mmol/L (ref 0.5–1.9)

## 2024-03-27 LAB — TROPONIN I (HIGH SENSITIVITY)
Troponin I (High Sensitivity): 15 ng/L (ref <18)
Troponin I (High Sensitivity): 16 ng/L (ref <18)

## 2024-03-27 MED ORDER — ONDANSETRON HCL 4 MG/2ML IJ SOLN: 4.0000 mg | Freq: Four times a day (QID) | INTRAMUSCULAR | Status: DC | PRN

## 2024-03-27 MED ORDER — FLUOXETINE HCL 20 MG PO CAPS
20.0000 mg | ORAL_CAPSULE | Freq: Every day | ORAL | Status: DC
  Administered 2024-03-28 – 2024-03-31 (×4): 20 mg via ORAL
  Filled 2024-03-27 (×4): qty 1

## 2024-03-27 MED ORDER — HEPARIN SODIUM (PORCINE) 5000 UNIT/ML IJ SOLN
5000.0000 [IU] | Freq: Three times a day (TID) | INTRAMUSCULAR | Status: DC
  Administered 2024-03-27 – 2024-03-31 (×12): 5000 [IU] via SUBCUTANEOUS
  Filled 2024-03-27 (×12): qty 1

## 2024-03-27 MED ORDER — HYDRALAZINE HCL 20 MG/ML IJ SOLN: 5.0000 mg | Freq: Four times a day (QID) | INTRAMUSCULAR | Status: DC | PRN

## 2024-03-27 MED ORDER — ONDANSETRON HCL 4 MG PO TABS
4.0000 mg | ORAL_TABLET | Freq: Four times a day (QID) | ORAL | Status: DC | PRN
Start: 2024-03-27 — End: 2024-04-01

## 2024-03-27 MED ORDER — SODIUM CHLORIDE 0.9 % IV SOLN
2.0000 g | INTRAVENOUS | Status: DC
  Administered 2024-03-27 – 2024-03-30 (×4): 2 g via INTRAVENOUS
  Filled 2024-03-27 (×5): qty 20

## 2024-03-27 MED ORDER — SENNA 8.6 MG PO TABS
2.0000 | ORAL_TABLET | Freq: Every day | ORAL | Status: DC
  Administered 2024-03-28 – 2024-03-30 (×3): 17.2 mg via ORAL
  Filled 2024-03-27 (×3): qty 2

## 2024-03-27 MED ORDER — CARVEDILOL 6.25 MG PO TABS
6.2500 mg | ORAL_TABLET | Freq: Two times a day (BID) | ORAL | Status: DC
  Administered 2024-03-28 – 2024-03-31 (×7): 6.25 mg via ORAL
  Filled 2024-03-27 (×7): qty 1

## 2024-03-27 MED ORDER — SODIUM CHLORIDE 0.9 % IV BOLUS
500.0000 mL | Freq: Once | INTRAVENOUS | Status: AC
  Administered 2024-03-27: 500 mL via INTRAVENOUS

## 2024-03-27 MED ORDER — NITROGLYCERIN 0.4 MG SL SUBL: 0.4000 mg | SUBLINGUAL_TABLET | SUBLINGUAL | Status: DC | PRN

## 2024-03-27 MED ORDER — ATORVASTATIN CALCIUM 20 MG PO TABS
40.0000 mg | ORAL_TABLET | Freq: Every evening | ORAL | Status: DC
  Administered 2024-03-28 – 2024-03-30 (×3): 40 mg via ORAL
  Filled 2024-03-27 (×3): qty 2

## 2024-03-27 MED ORDER — ASPIRIN 81 MG PO TBEC
81.0000 mg | DELAYED_RELEASE_TABLET | Freq: Every day | ORAL | Status: DC
  Administered 2024-03-28 – 2024-03-31 (×4): 81 mg via ORAL
  Filled 2024-03-27 (×4): qty 1

## 2024-03-27 MED ORDER — ACETAMINOPHEN 325 MG PO TABS: 650.0000 mg | ORAL_TABLET | Freq: Four times a day (QID) | ORAL | Status: DC | PRN

## 2024-03-27 MED ORDER — AMLODIPINE BESYLATE 10 MG PO TABS
10.0000 mg | ORAL_TABLET | Freq: Every day | ORAL | Status: DC
  Administered 2024-03-28 – 2024-03-31 (×4): 10 mg via ORAL
  Filled 2024-03-27 (×2): qty 1
  Filled 2024-03-27: qty 2
  Filled 2024-03-27: qty 1

## 2024-03-27 MED ORDER — POLYETHYLENE GLYCOL 3350 17 G PO PACK: 17.0000 g | PACK | Freq: Two times a day (BID) | ORAL | Status: DC | PRN

## 2024-03-27 MED ORDER — ACETAMINOPHEN 650 MG RE SUPP: 650.0000 mg | Freq: Four times a day (QID) | RECTAL | Status: DC | PRN

## 2024-03-27 MED ORDER — SENNOSIDES-DOCUSATE SODIUM 8.6-50 MG PO TABS: 1.0000 | ORAL_TABLET | Freq: Every evening | ORAL | Status: DC | PRN

## 2024-03-27 NOTE — ED Notes (Signed)
 Pt back from CT

## 2024-03-27 NOTE — ED Notes (Signed)
 This RN spoke to NP at facility to give report and update on patient care.

## 2024-03-27 NOTE — Assessment & Plan Note (Signed)
 Aspirin  81 mg daily, atorvastatin  40 mg every evening, were resumed on admission

## 2024-03-27 NOTE — Assessment & Plan Note (Signed)
Home fluoxetine 20 mg daily resumed

## 2024-03-27 NOTE — ED Triage Notes (Signed)
 Pt BIB [facility] from [facility] c/o new onset of altered mental status starting last night. Pt is non verbal at baseline and can communicate via hand squeezes. Pt has L side deficits from a previous stroke and is being treated for cellulitis. EMS was called due to pt not being able to get up from bed like he used to.   98.3 oral temp 122/78 92 cbg

## 2024-03-27 NOTE — ED Notes (Signed)
 Pt failed bedside swallow screen. Order placed for SLP evaluation.

## 2024-03-27 NOTE — Assessment & Plan Note (Signed)
 Etiology workup in progress Blood cultures x 2 have been ordered on admission UA was negative for leukocytes nitrates, and positive for rare bacteria Given patient none verbal and presenting with altered mentation with increasing weakness per facility, we will initiate ceftriaxone  2 g IV daily on admission Recheck CBC in the a.m.

## 2024-03-27 NOTE — Hospital Course (Addendum)
 [name] is a 68 year old male with history of depression, hypertension, nonischemic cardiomyopathy, seizure, history of multiple CVA, with right-sided weakness, expressive aphasia, history of cocaine use, inadequate social support, who presents ED for chief concerns of mental status change.  Per [facility], patient is nonverbal at baseline and usually communicates by squeezing his hands.  And at baseline, patient is able to get up out of bed in the morning. However, he was not able to do so prompting staff to call EMS to take patient to ED for further evaluation.  Vitals in the ED showed T of 98.6, respiration rate 19, heart rate 62, blood pressure 112/79, SpO2 100% on room air.  Serum sodium is 144, potassium 3.9, chloride 110, bicarb 26, BUN of 43, serum creatinine 1.40, EGFR 56, nonfasting blood glucose 77, WBC 16, hemoglobin 12.2, platelets of 352.  Lactic acid 1.1.  COVID/influenza A/influenza B/RSV PCR were negative.  HS troponin was 15.  UA was negative for leukocytes and nitrates.  1 set of blood cultures were sent from the ED.  ED treatment: NS 500 mL liter bolus.

## 2024-03-27 NOTE — Assessment & Plan Note (Addendum)
 Etiology workup in progress MRI of the brain ordered Blood cultures x 2 have been ordered.  1 has been sent in progress and 1 is pending collection at this time Procalcitonin is in progress Portable chest x-ray ordered on admission

## 2024-03-27 NOTE — ED Provider Notes (Signed)
 [facility] Provider Note    Event Date/Time   First MD Initiated Contact with Patient 03/27/24 1241     (approximate)   History   Altered Mental Status   HPI  Mark Shepard is a 68 y.o. 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  The patient is nonverbal at baseline and communicates via hand squeezes.  EMS reported the patient was not able to get up from bed like he is normally able to.  They report that he is currently being treated for cellulitis.  The patient himself is unable to give any history due to his aphasia.  I reviewed the past medical records.  The patient's most recent outpatient encounter was with ophthalmology in 2023 for cataracts.  He had an ED visit in late 2023 for increased left-sided weakness but had a negative workup at that time.   Physical Exam   Triage Vital Signs: ED Triage Vitals  Encounter Vitals Group     BP 03/27/24 1243 112/79     Systolic BP Percentile --      Diastolic BP Percentile --      Pulse Rate 03/27/24 1243 62     Resp 03/27/24 1243 19     Temp 03/27/24 1243 98.6 F (37 C)     Temp Source 03/27/24 1243 Oral     SpO2 03/27/24 1243 100 %     Weight 03/27/24 1247 121 lb 1.6 oz (54.9 kg)     Height 03/27/24 1247 5\' 6"  (1.676 m)     Head Circumference --      Peak Flow --      Pain Score --      Pain Loc --      Pain Education --      Exclude from Growth Chart --     Most recent vital signs: Vitals:   03/27/24 1243  BP: 112/79  Pulse: 62  Resp: 19  Temp: 98.6 F (37 C)  SpO2: 100%     General: Awake, no distress.  CV:  Good peripheral perfusion.  Resp:  Normal effort.  Lungs CTAB. Abd:  No distention. Other:  EOMI.  PERRLA.  Aphasia.  Left hemiplegia but patient able to move both hands and feet.   ED Results / Procedures / Treatments    Labs (all labs ordered are listed, but only abnormal results are displayed) Labs Reviewed  COMPREHENSIVE METABOLIC PANEL WITH GFR - Abnormal; Notable for the following components:      Result Value   BUN 43 (*)    Creatinine, Ser 1.40 (*)    Calcium  8.7 (*)    Albumin 2.4 (*)    GFR, Estimated 56 (*)    All other components within normal limits  CBC WITH DIFFERENTIAL/PLATELET - Abnormal; Notable for the following components:   WBC 16.0 (*)    RBC 4.03 (*)    Hemoglobin 12.2 (*)    HCT 38.5 (*)    Neutro Abs 11.6 (*)    Monocytes Absolute 1.6 (*)    Abs Immature Granulocytes 0.15 (*)    All other components within normal limits  URINALYSIS, ROUTINE W REFLEX MICROSCOPIC - Abnormal; Notable for the following components:   Color, Urine YELLOW (*)    APPearance HAZY (*)    Hgb urine dipstick SMALL (*)    Bacteria, UA RARE (*)  All other components within normal limits  RESP PANEL BY RT-PCR (RSV, FLU A&B, COVID)  RVPGX2  CULTURE, BLOOD (SINGLE)  LACTIC ACID, PLASMA  LACTIC ACID, PLASMA  PROCALCITONIN  TROPONIN I (HIGH SENSITIVITY)  TROPONIN I (HIGH SENSITIVITY)     EKG  ED ECG REPORT I, Lind Repine, the attending physician, personally viewed and interpreted this ECG.  Date: 03/27/2024 EKG Time: 1258 Rate: 67 Rhythm: normal sinus rhythm QRS Axis: normal Intervals: normal ST/T Wave abnormalities: Nonspecific ST abnormalities Narrative Interpretation: no evidence of acute ischemia    RADIOLOGY  CT head: I independently viewed and interpreted the images; there is no ICH   IMPRESSION:  1. No acute intracranial abnormality or significant interval change.  2. Stable atrophy and white matter disease. This likely reflects the  sequela of chronic microvascular ischemia.  3. Stable remote lacunar infarcts of the right caudate head and  corona radiata bilaterally.  4. Asymmetric swelling over the right side of the face. No  underlying etiology is evident.      PROCEDURES:  Critical Care performed: No  Procedures   MEDICATIONS ORDERED IN ED: Medications  sodium chloride  0.9 % bolus 500 mL (has no administration in time range)     IMPRESSION / MDM / ASSESSMENT AND PLAN / ED COURSE  I reviewed the triage vital signs and the nursing notes.  68 year old male with PMH as noted above presents with altered mental status/generalized weakness, acute onset last night.  The patient himself is unable to give any history.  On exam his vital signs are normal.  He is alert and able to follow commands.  He is able to make small movements of all extremities and there are no focal neurologic deficits other than his known deficits from prior strokes.  Differential diagnosis includes, but is not limited to, UTI, other infection, dehydration, electrolyte abnormality, AKI, other metabolic cause, less likely new stroke or other CNS etiology, or possible cardiac etiology.  We will obtain CT head, lab workup, and reassess.  Patient's presentation is most consistent with acute presentation with potential threat to life or bodily function.  The patient is on the cardiac monitor to evaluate for evidence of arrhythmia and/or significant heart rate changes.  ----------------------------------------- 3:27 PM on 03/27/2024 -----------------------------------------  Lab workup is significant for leukocytosis of 16.  CMP is overall unremarkable.  Urinalysis does not show any convincing findings of UTI.  CT is negative for intracranial findings although there is some facial swelling.  There is no evidence of facial cellulitis by my exam.  The patient will need admission for further workup and monitoring.  I consulted Dr. Reinhold Carbine from the hospitalist service; based on our discussion she agrees to evaluate the patient for admission.    FINAL CLINICAL IMPRESSION(S) / ED DIAGNOSES   Final diagnoses:  Altered mental status, unspecified altered mental status type     Rx  / DC Orders   ED Discharge Orders     None        Note:  This document was prepared using Dragon voice recognition software and may include unintentional dictation errors.    Lind Repine, MD 03/27/24 [phone]

## 2024-03-27 NOTE — H&P (Addendum)
 History and Physical   Mark Shepard WUJ:[id number] DOB: 04-04-1956 DOA: 03/27/2024  PCP: Donnalee Gab  Patient coming from: [facility] via EMS  I have personally briefly reviewed patient's old medical records in [facility] EMR.  Chief Concern: Altered mental status  HPI: [name] is a 68 year old male with history of depression, hypertension, nonischemic cardiomyopathy, seizure, history of multiple CVA, with right-sided weakness, expressive aphasia, history of cocaine use, inadequate social support, who presents ED for chief concerns of mental status change.  Per [facility], patient is nonverbal at baseline and usually communicates by squeezing his hands.  And at baseline, patient is able to get up out of bed in the morning. However, he was not able to do so prompting staff to call EMS to take patient to ED for further evaluation.  Vitals in the ED showed T of 98.6, respiration rate 19, heart rate 62, blood pressure 112/79, SpO2 100% on room air.  Serum sodium is 144, potassium 3.9, chloride 110, bicarb 26, BUN of 43, serum creatinine 1.40, EGFR 56, nonfasting blood glucose 77, WBC 16, hemoglobin 12.2, platelets of 352.  Lactic acid 1.1.  COVID/influenza A/influenza B/RSV PCR were negative.  HS troponin was 15.  UA was negative for leukocytes and nitrates.  1 set of blood cultures were sent from the ED.  ED treatment: NS 500 mL liter bolus. ------------------------------- At bedside, patient patient looks at me and clearly knows that I am talking to him.  He is not able to answer back questions.   Social history: He is from [facility].  ROS: Able to complete due to patient nonverbal at baseline  ED Course: Discussed with EDP, patient requiring hospitalization for chief concerns of altered mental status.  Assessment/Plan  Principal Problem:   Altered mental status Active Problems:   Acute ischemic stroke (HCC)   Leukocytosis   Cardiomyopathy (HCC)    Hypertension   History of depression   ICH (intracerebral hemorrhage) (HCC)   Lumbar radiculopathy   Assessment and Plan:  * Altered mental status Etiology workup in progress MRI of the brain ordered Blood cultures x 2 have been ordered.  1 has been sent in progress and 1 is pending collection at this time Procalcitonin is in progress Portable chest x-ray ordered on admission  Leukocytosis Etiology workup in progress Blood cultures x 2 have been ordered on admission UA was negative for leukocytes nitrates, and positive for rare bacteria Given patient none verbal and presenting with altered mentation with increasing weakness per facility, we will initiate ceftriaxone  2 g IV daily on admission Recheck CBC in the a.m.  Acute ischemic stroke (HCC) Aspirin  81 mg daily, atorvastatin  40 mg every evening, were resumed on admission  Hypertension Amlodipine  10 mg daily, carvedilol  12.5 mg p.o. twice daily with meals were resumed on admission Pending med reconciliation, hydralazine  50 mg tablet, 1.5 tablet every 8 hours is pending med reconciliation at this time Hydralazine  5 mg IV q6h prn SBP > 165, 5 days ordered  History of depression Home fluoxetine  20 mg daily resumed  Chart reviewed.   DVT prophylaxis: Heparin  5000 units subcutaneous every 8 hours Code Status: DNR/DNI per MOST form at bedside Diet: N.p.o. pending formal SLP evaluation Family Communication: No Disposition Plan: Pending clinical course Consults called: None at this time Admission status: Telemetry medical, observation  Past Medical History:  Diagnosis Date   CKD (chronic kidney disease), stage III (HCC)    Depression    Expressive aphasia  S/P stroke. Communicates by writing.   Hyperlipidemia    Hypertension    Right sided weakness    S/P stroke   Salivary secretion disorder    Seizure (HCC)    Stroke [facility])    Past Surgical History:  Procedure Laterality Date   CATARACT EXTRACTION W/PHACO Right  04/24/2022   Procedure: CATARACT EXTRACTION PHACO AND INTRAOCULAR LENS PLACEMENT (IOC) RIGHT COMPLICATED HEALON 5 VISION BLUE OMIDRIA  19.64 02:01.5;  Surgeon: Trudi Fus, MD;  Location: [facility];  Service: Ophthalmology;  Laterality: Right;   CATARACT EXTRACTION W/PHACO Left 06/11/2022   Procedure: CATARACT EXTRACTION PHACO AND INTRAOCULAR LENS PLACEMENT (IOC) LEFT HEALON5 VISION BLUE OMIDIRA;  Surgeon: Trudi Fus, MD;  Location: [facility];  Service: Ophthalmology;  Laterality: Left;  last case requested regular bed NOT eye bed 7.73 01:17.3   IR REPLC GASTRO/COLONIC TUBE PERCUT W/FLUORO  09/25/2021   Social History:  reports that he has quit smoking. He has never used smokeless tobacco. He reports that he does not currently use alcohol after a past usage of about 8.0 standard drinks of alcohol per week. He reports that he does not currently use drugs.  No Known Allergies Family History  Problem Relation Age of Onset   Cancer Mother    Heart attack Father    Family history: Family history reviewed and not pertinent.  Prior to Admission medications   Medication Sig Start Date End Date Taking? Authorizing Provider  acetaminophen  (TYLENOL ) 325 MG tablet Take 650 mg by mouth every 4 (four) hours as needed for mild pain or moderate pain.    [provider]  amLODipine  (NORVASC ) 10 MG tablet Take 10 mg by mouth daily.    [provider]  aspirin  81 MG EC tablet Take 81 mg by mouth daily.    [provider]  atorvastatin  (LIPITOR) 80 MG tablet Take 40 mg by mouth every evening.    [provider]  Carboxymethylcellulose Sodium (THERATEARS) 0.25 % SOLN Place 2 drops into both eyes 4 (four) times daily as needed (dry eyes).    [provider]  carvedilol  (COREG ) 12.5 MG tablet Take 12.5 mg by mouth 2 (two) times daily with a meal.    [provider]  diclofenac Sodium (VOLTAREN) 1 % GEL Apply 2 g  topically 4 (four) times daily. (Apply to right shoulder)    [provider]  FLUoxetine  (PROZAC ) 40 MG capsule Take 40 mg by mouth daily.    [provider]  gabapentin  (NEURONTIN ) 100 MG capsule Take 400 mg by mouth every 8 (eight) hours.    [provider]  glycopyrrolate (ROBINUL) 1 MG tablet Take 1 mg by mouth 3 (three) times daily.    [provider]  hydrALAZINE  (APRESOLINE ) 10 MG tablet Take 20 mg by mouth 2 (two) times daily. Patient not taking: Reported on 10/09/2022 04/21/22   [provider]  hydrALAZINE  (APRESOLINE ) 25 MG tablet Take by mouth. Patient not taking: Reported on 10/09/2022 05/04/21   [provider]  hydrALAZINE  (APRESOLINE ) 50 MG tablet Take 1.5 tablets by mouth every 8 (eight) hours. 09/23/22   [provider]  ipratropium-albuterol (DUONEB) 0.5-2.5 (3) MG/3ML SOLN Take 3 mLs by nebulization every 6 (six) hours as needed (Wheezing). 07/29/22   [provider]  lidocaine  (LIDODERM ) 5 % Place 2 patches onto the skin daily. Remove & Discard patch within 12 hours or as directed by MD    [provider]  lisinopril (ZESTRIL) 20  MG tablet Take 40 mg by mouth daily.    [provider]  methadone  (DOLOPHINE ) 5 MG tablet Take 5 mg by mouth 2 (two) times daily as needed. 09/15/22   [provider]  moxifloxacin  (VIGAMOX ) 0.5 % ophthalmic solution Apply to eye. 07/28/22   [provider]  Multiple Vitamins-Minerals (THERA-M) TABS Take 1 tablet by mouth daily.    [provider]  nitroGLYCERIN  (NITROSTAT ) 0.4 MG SL tablet Place 0.4 mg under the tongue every 5 (five) minutes as needed for chest pain.    [provider]  polyethylene glycol (MIRALAX  / GLYCOLAX ) 17 g packet Take 17 g by mouth daily.    [provider]  senna (SENOKOT) 8.6 MG TABS tablet Take 2 tablets by mouth at bedtime.    [provider]  thiamine 100 MG tablet Take 100 mg by mouth  daily.    [provider]  tiZANidine  (ZANAFLEX ) 2 MG tablet Take 2 mg by mouth 3 (three) times daily.    [provider]  traMADol-acetaminophen  (ULTRACET) 37.5-325 MG tablet Take 2 tablets by mouth 2 (two) times daily. Patient not taking: Reported on 10/09/2022 06/23/22   [provider]  triamcinolone acetonide (KENALOG-40) 40 MG/ML injection  05/06/22   [provider]  valproic acid (DEPAKENE) 250 MG capsule Take 250 mg by mouth 3 (three) times daily.    [provider]  Vitamin D, Ergocalciferol, (DRISDOL) 1.25 MG (50000 UNIT) CAPS capsule Take 50,000 Units by mouth every 30 (thirty) days. (First of the month)    [provider]   Physical Exam: Vitals:   03/27/24 1243 03/27/24 1247 03/27/24 1530  BP: 112/79  125/88  Pulse: 62  68  Resp: 19  11  Temp: 98.6 F (37 C)    TempSrc: Oral    SpO2: 100%  100%  Weight:  54.9 kg   Height:  5\' 6"  (1.676 m)    Constitutional: appears frail, chronically ill, poor hygiene Eyes: PERRL, lids and conjunctivae normal ENMT: Mucous membranes are moist. Posterior pharynx clear of any exudate or lesions. poor dentition.  Unable to assess hearing  Neck: normal, supple, no masses, no thyromegaly Respiratory: clear to auscultation bilaterally, no wheezing, no crackles. Normal respiratory effort. No accessory muscle use.  Cardiovascular: Regular rate and rhythm, no murmurs / rubs / gallops. No extremity edema. 2+ pedal pulses. No carotid bruits.  Abdomen: no tenderness, no masses palpated, no hepatosplenomegaly. Bowel sounds positive.  Musculoskeletal: no clubbing / cyanosis.  Bilateral lower extremity HFE.  No contractures.  Unable to assess range of motion and muscle tone..  Skin: no rashes, lesions, ulcers. No induration Neurologic: Unable to assess sensation, strength  Psychiatric: Unable to assess judgment, insight, alertness, orientation, mood .   EKG: independently reviewed, showing sinus rhythm  with rate of 67, QTc 476  Chest x-ray on Admission: I personally reviewed and I agree with radiologist reading as below.  DG Chest Port 1 View Result Date: 03/27/2024 CLINICAL DATA:  045409 Altered mental status [id number] EXAM: PORTABLE CHEST - 1 VIEW COMPARISON:  10/09/2022 FINDINGS: Lungs clear. Heart size and mediastinal contours are within normal limits. Atheromatous ectatic thoracic aorta. No effusion. Visualized bones unremarkable. IMPRESSION: No acute cardiopulmonary disease. Electronically Signed   By: Nicoletta Barrier M.D.   On: 03/27/2024 16:41   CT Head Wo Contrast Result Date: 03/27/2024 CLINICAL DATA:  Altered mental status. EXAM: CT HEAD WITHOUT CONTRAST TECHNIQUE: Contiguous axial images were obtained from the base of the  skull through the vertex without intravenous contrast. RADIATION DOSE REDUCTION: This exam was performed according to the departmental dose-optimization program which includes automated exposure control, adjustment of the mA and/or kV according to patient size and/or use of iterative reconstruction technique. COMPARISON:  CT head without contrast 02/28/2022 FINDINGS: Brain: Remote lacunar infarcts of the right caudate head are similar the prior study. Remote lacunar infarcts of the corona radiata are stable bilaterally. No acute infarct, hemorrhage, or mass lesion is present. Moderate atrophy and white matter changes are similar the prior exam. The ventricles are proportionate to the degree of atrophy. Remote ischemic changes are present in the right external capsule. The brainstem and cerebellum are within normal limits. Enlarged relatively empty sella is similar the prior study. Midline structures are otherwise within normal limits. Vascular: Atherosclerotic calcifications are present within the cavernous internal carotid arteries bilaterally. No hyperdense vessel is present. Skull: Asymmetric swelling is present over the right side of the face. No underlying etiology is evident.  Calvarium is intact. Sinuses/Orbits: The paranasal sinuses and mastoid air cells are clear. Bilateral lens replacements are noted. Globes and orbits are otherwise unremarkable. IMPRESSION: 1. No acute intracranial abnormality or significant interval change. 2. Stable atrophy and white matter disease. This likely reflects the sequela of chronic microvascular ischemia. 3. Stable remote lacunar infarcts of the right caudate head and corona radiata bilaterally. 4. Asymmetric swelling over the right side of the face. No underlying etiology is evident. Electronically Signed   By: Audree Leas M.D.   On: 03/27/2024 13:40   Labs on Admission: I have personally reviewed following labs  CBC: Recent Labs  Lab 03/27/24 1251  WBC 16.0*  NEUTROABS 11.6*  HGB 12.2*  HCT 38.5*  MCV 95.5  PLT 352   Basic Metabolic Panel: Recent Labs  Lab 03/27/24 1251  NA 144  K 3.9  CL 110  CO2 26  GLUCOSE 77  BUN 43*  CREATININE 1.40*  CALCIUM  8.7*   GFR: Estimated Creatinine Clearance: 40.8 mL/min (A) (by C-G formula based on SCr of 1.4 mg/dL (H)).  Liver Function Tests: Recent Labs  Lab 03/27/24 1251  AST 25  ALT 27  ALKPHOS 45  BILITOT 0.6  PROT 6.7  ALBUMIN 2.4*   Urine analysis:    Component Value Date/Time   COLORURINE YELLOW (A) 03/27/2024 1251   APPEARANCEUR HAZY (A) 03/27/2024 1251   LABSPEC 1.024 03/27/2024 1251   PHURINE 5.0 03/27/2024 1251   GLUCOSEU NEGATIVE 03/27/2024 1251   HGBUR SMALL (A) 03/27/2024 1251   BILIRUBINUR NEGATIVE 03/27/2024 1251   KETONESUR NEGATIVE 03/27/2024 1251   PROTEINUR NEGATIVE 03/27/2024 1251   NITRITE NEGATIVE 03/27/2024 1251   LEUKOCYTESUR NEGATIVE 03/27/2024 1251   This document was prepared using Dragon Voice Recognition software and may include unintentional dictation errors.  Dr. Reinhold Carbine [facility]  If 7PM-7AM, please contact overnight-coverage provider If 7AM-7PM, please contact day attending  provider [email or web address]  03/27/2024, 5:01 PM

## 2024-03-27 NOTE — ED Notes (Signed)
 Pt brief changed and pt repositioned for comfort.

## 2024-03-27 NOTE — Assessment & Plan Note (Addendum)
 Amlodipine  10 mg daily, carvedilol  12.5 mg p.o. twice daily with meals were resumed on admission Pending med reconciliation, hydralazine  50 mg tablet, 1.5 tablet every 8 hours is pending med reconciliation at this time Hydralazine  5 mg IV q6h prn SBP > 165, 5 days ordered

## 2024-03-27 NOTE — ED Notes (Signed)
 This RN and Musician provided pericare, applied a new brief and linen change to pt. Warm blankets provided. Pt tolerated well. NAD, CB within reach.

## 2024-03-28 ENCOUNTER — Inpatient Hospital Stay

## 2024-03-28 ENCOUNTER — Other Ambulatory Visit: Payer: Self-pay

## 2024-03-28 DIAGNOSIS — I10 Essential (primary) hypertension: Secondary | ICD-10-CM | POA: Diagnosis not present

## 2024-03-28 DIAGNOSIS — Z8249 Family history of ischemic heart disease and other diseases of the circulatory system: Secondary | ICD-10-CM | POA: Diagnosis not present

## 2024-03-28 DIAGNOSIS — Z681 Body mass index (BMI) 19 or less, adult: Secondary | ICD-10-CM | POA: Diagnosis not present

## 2024-03-28 DIAGNOSIS — E870 Hyperosmolality and hypernatremia: Secondary | ICD-10-CM | POA: Diagnosis present

## 2024-03-28 DIAGNOSIS — R569 Unspecified convulsions: Secondary | ICD-10-CM | POA: Diagnosis present

## 2024-03-28 DIAGNOSIS — E43 Unspecified severe protein-calorie malnutrition: Secondary | ICD-10-CM | POA: Diagnosis present

## 2024-03-28 DIAGNOSIS — Z87891 Personal history of nicotine dependence: Secondary | ICD-10-CM | POA: Diagnosis not present

## 2024-03-28 DIAGNOSIS — Z1152 Encounter for screening for COVID-19: Secondary | ICD-10-CM | POA: Diagnosis not present

## 2024-03-28 DIAGNOSIS — I69351 Hemiplegia and hemiparesis following cerebral infarction affecting right dominant side: Secondary | ICD-10-CM | POA: Diagnosis not present

## 2024-03-28 DIAGNOSIS — Z79899 Other long term (current) drug therapy: Secondary | ICD-10-CM | POA: Diagnosis not present

## 2024-03-28 DIAGNOSIS — I69391 Dysphagia following cerebral infarction: Secondary | ICD-10-CM | POA: Diagnosis not present

## 2024-03-28 DIAGNOSIS — E876 Hypokalemia: Secondary | ICD-10-CM | POA: Diagnosis not present

## 2024-03-28 DIAGNOSIS — N1831 Chronic kidney disease, stage 3a: Secondary | ICD-10-CM | POA: Diagnosis present

## 2024-03-28 DIAGNOSIS — K1120 Sialoadenitis, unspecified: Secondary | ICD-10-CM | POA: Diagnosis present

## 2024-03-28 DIAGNOSIS — I129 Hypertensive chronic kidney disease with stage 1 through stage 4 chronic kidney disease, or unspecified chronic kidney disease: Secondary | ICD-10-CM | POA: Diagnosis present

## 2024-03-28 DIAGNOSIS — R410 Disorientation, unspecified: Secondary | ICD-10-CM | POA: Diagnosis not present

## 2024-03-28 DIAGNOSIS — E785 Hyperlipidemia, unspecified: Secondary | ICD-10-CM | POA: Diagnosis present

## 2024-03-28 DIAGNOSIS — R1310 Dysphagia, unspecified: Secondary | ICD-10-CM | POA: Diagnosis present

## 2024-03-28 DIAGNOSIS — F32A Depression, unspecified: Secondary | ICD-10-CM | POA: Diagnosis present

## 2024-03-28 DIAGNOSIS — Z66 Do not resuscitate: Secondary | ICD-10-CM | POA: Diagnosis present

## 2024-03-28 DIAGNOSIS — N390 Urinary tract infection, site not specified: Secondary | ICD-10-CM | POA: Diagnosis present

## 2024-03-28 DIAGNOSIS — I69320 Aphasia following cerebral infarction: Secondary | ICD-10-CM | POA: Diagnosis not present

## 2024-03-28 DIAGNOSIS — I428 Other cardiomyopathies: Secondary | ICD-10-CM | POA: Diagnosis present

## 2024-03-28 DIAGNOSIS — Z7401 Bed confinement status: Secondary | ICD-10-CM | POA: Diagnosis not present

## 2024-03-28 DIAGNOSIS — R4182 Altered mental status, unspecified: Secondary | ICD-10-CM | POA: Diagnosis present

## 2024-03-28 DIAGNOSIS — E871 Hypo-osmolality and hyponatremia: Secondary | ICD-10-CM | POA: Diagnosis present

## 2024-03-28 LAB — CBC
HCT: 36.4 % — ABNORMAL LOW (ref 39.0–52.0)
Hemoglobin: 11.7 g/dL — ABNORMAL LOW (ref 13.0–17.0)
MCH: 30.2 pg (ref 26.0–34.0)
MCHC: 32.1 g/dL (ref 30.0–36.0)
MCV: 93.8 fL (ref 80.0–100.0)
Platelets: 342 10*3/uL (ref 150–400)
RBC: 3.88 MIL/uL — ABNORMAL LOW (ref 4.22–5.81)
RDW: 13.9 % (ref 11.5–15.5)
WBC: 13.4 10*3/uL — ABNORMAL HIGH (ref 4.0–10.5)
nRBC: 0 % (ref 0.0–0.2)

## 2024-03-28 LAB — BASIC METABOLIC PANEL WITH GFR
Anion gap: 11 (ref 5–15)
BUN: 37 mg/dL — ABNORMAL HIGH (ref 8–23)
CO2: 25 mmol/L (ref 22–32)
Calcium: 8.7 mg/dL — ABNORMAL LOW (ref 8.9–10.3)
Chloride: 110 mmol/L (ref 98–111)
Creatinine, Ser: 1.26 mg/dL — ABNORMAL HIGH (ref 0.61–1.24)
GFR, Estimated: >60 mL/min (ref >60)
Glucose, Bld: 73 mg/dL (ref 70–99)
Potassium: 3.7 mmol/L (ref 3.5–5.1)
Sodium: 146 mmol/L — ABNORMAL HIGH (ref 135–145)

## 2024-03-28 MED ORDER — NEPRO/CARBSTEADY PO LIQD
237.0000 mL | Freq: Three times a day (TID) | ORAL | Status: DC
  Administered 2024-03-28 – 2024-03-30 (×5): 237 mL via ORAL

## 2024-03-28 MED ORDER — IOHEXOL 350 MG/ML SOLN
75.0000 mL | Freq: Once | INTRAVENOUS | Status: AC | PRN
  Administered 2024-03-28: 75 mL via INTRAVENOUS

## 2024-03-28 MED ORDER — SODIUM CHLORIDE 0.45 % IV SOLN: INTRAVENOUS | Status: DC

## 2024-03-28 NOTE — ED Notes (Signed)
 SLP evaluation complete.

## 2024-03-28 NOTE — ED Notes (Signed)
 Dr. Carlette Cheers at bedside rounding.

## 2024-03-28 NOTE — ED Notes (Signed)
 Pt turned, cleaned and changed into new brief. Breathing even and and unlabored. No acute changes noted.

## 2024-03-28 NOTE — Plan of Care (Signed)

## 2024-03-28 NOTE — ED Notes (Signed)
 Pt brief changed. Pt repositioned with some resistance from pt.

## 2024-03-28 NOTE — Evaluation (Signed)
 Clinical/Bedside Swallow Evaluation Patient Details  Name: Mark Shepard MRN: [id number] Date of Birth: [birth year]  Today's Date: 03/28/2024 Time: SLP Start Time (ACUTE ONLY): 0930 SLP Stop Time (ACUTE ONLY): 1000 SLP Time Calculation (min) (ACUTE ONLY): 30 min  Past Medical History:  Past Medical History:  Diagnosis Date   CKD (chronic kidney disease), stage III (HCC)    Depression    Expressive aphasia    S/P stroke. Communicates by writing.   Hyperlipidemia    Hypertension    Right sided weakness    S/P stroke   Salivary secretion disorder    Seizure (HCC)    Stroke [facility])    Past Surgical History:  Past Surgical History:  Procedure Laterality Date   CATARACT EXTRACTION W/PHACO Right 04/24/2022   Procedure: CATARACT EXTRACTION PHACO AND INTRAOCULAR LENS PLACEMENT (IOC) RIGHT COMPLICATED HEALON 5 VISION BLUE OMIDRIA  19.64 02:01.5;  Surgeon: Trudi Fus, MD;  Location: [facility];  Service: Ophthalmology;  Laterality: Right;   CATARACT EXTRACTION W/PHACO Left 06/11/2022   Procedure: CATARACT EXTRACTION PHACO AND INTRAOCULAR LENS PLACEMENT (IOC) LEFT HEALON5 VISION BLUE OMIDIRA;  Surgeon: Trudi Fus, MD;  Location: [facility];  Service: Ophthalmology;  Laterality: Left;  last case requested regular bed NOT eye bed 7.73 01:17.3   IR REPLC GASTRO/COLONIC TUBE PERCUT W/FLUORO  09/25/2021   HPI:  baseline and usually communicates by squeezing his hands.  And at baseline, patient is able to get up out of bed in the morning. However, he was not able to do so prompting staff to call EMS to take patient to ED for further evaluation. MRI Brain 03/27/24: Partially imaged edema involving the right parotid gland and  surrounding soft tissues, concerning for parotiditis and cellulitis.  Less likely malignancy. Recommend correlation with physical exam and  consider CT of the neck with contrast to better evaluate.  2. T2 hyperintensity in the right transverse  and sigmoid sinus could  represent slow flow or thrombus. Recommend postcontrast MRI or CTV  to further evaluate.  3. Otherwise, no evidence of acute intracranial abnormality.  4. Evidence of prior hemorrhage in the right basal ganglia and  overlying white matter. CXR 03/27/24: No acute cardiopulmonary disease.    Assessment / Plan / Recommendation  Clinical Impression   Pt presents to bedside swallow in the setting of altered mental status. Pt with history of dysphagia. MBSS completed in 2023, revealing "Pt presents for repeat instrumental to objectively assess his ability to protect his airway when consuming advanced solids. Pt continues with chronic profound oral phase deficits that result in prolonged mastication of solids but pt is functional and is appropriate to consume more advanced solids at his facility. Unfortunately, pt's pharyngeal phase has declined as he is now silently aspirating thin liquids via single cup sips. Pt presents with delayed swallow initiation with bolus resting in the pyriform sinuses before initiating a swallow. Unfortunately, with cup sips, he aspirated during the swallow as his pyriform sinuses were not able to contain the larger bolus. However when presented with 5ml of thin liquids, pt is able to contain the liquid and he didn't display any aspiration. As such, recommend pt consume advanced solids with thin liquids VIA RESTRICTED FLOW CUP. If a restricted flow cup is not readily available, pt should consume nectar thick liquids via cup until one is available." RN unsure of baseline diet prior to hospital admission. No additional SLP notes seen during chart review.   Today pt alert, on room air,  afebrile, and agreeable to session. Pt assisted with upright positioning, with noted forward lean. Limited command following, though cursory oral motor exam revealing right sided labial weakness and symmetrical lingual excursion, though reduced extension. Pt seen with trials of ice  chip, thin liquids via tsp, nectar via cup/tsp, puree and regular solids. Pt with overt cough with thin liquids via tsp and nectar via cup. O2 saturations fluctuating between 92-100 with liquid trials. Oral phase notable for prolonged oral manipulation and mastication with solids, with increased efficiency noted with puree solids. Liquid wash and visual assessment utilized to ensure oral clearance.   Given pt history of dysphagia (specifically silent aspiration), current deconditioning, altered mental status, and dependency with feeding, pt is increased for aspiration. Recommend strict aspiration precautions, including administration via tsp for all trials, assistance for upright positioning, alert for PO, slow rate, visual inspection for oral clearance, and monitor for endurance. Oral care to be completed before and after intake.   Pt asking for water at end of session. Education shared with RN regarding allowance of ice chips following oral care with supervision, when upright and alert.  SLP will closely monitor, suspect need for repeat instrumental assessment prior to diet advancement. RN and MD aware of  recommendations. Pt likely to benefit from dietician consult.   SLP Visit Diagnosis: Dysphagia, oropharyngeal phase (R13.12) (with impact of AMS, suspect acute on chronic in nature)    Aspiration Risk  Moderate aspiration risk    Diet Recommendation   Nectar;Dysphagia 1 (puree)  Medication Administration: Crushed with puree    Other  Recommendations Oral Care Recommendations: Oral care QID;Oral care prior to ice chip/H20;Oral care before and after PO;Staff/trained caregiver to provide oral care    Recommendations for follow up therapy are one component of a multi-disciplinary discharge planning process, led by the attending physician.  Recommendations may be updated based on patient status, additional functional criteria and insurance authorization.  Follow up Recommendations Follow  physician's recommendations for discharge plan and follow up therapies      Assistance Recommended at Discharge  Likely full assist with PO intake  Functional Status Assessment Patient has had a recent decline in their functional status and demonstrates the ability to make significant improvements in function in a reasonable and predictable amount of time.  Frequency and Duration min 2x/week  2 weeks       Prognosis Prognosis for improved oropharyngeal function: Fair Barriers to Reach Goals: Severity of deficits (suspected baseline deficits)      Swallow Study   General Date of Onset: 03/28/24 HPI: baseline and usually communicates by squeezing his hands.  And at baseline, patient is able to get up out of bed in the morning. However, he was not able to do so prompting staff to call EMS to take patient to ED for further evaluation. MRI Brain 03/27/24: Partially imaged edema involving the right parotid gland and  surrounding soft tissues, concerning for parotiditis and cellulitis.  Less likely malignancy. Recommend correlation with physical exam and  consider CT of the neck with contrast to better evaluate.  2. T2 hyperintensity in the right transverse and sigmoid sinus could  represent slow flow or thrombus. Recommend postcontrast MRI or CTV  to further evaluate.  3. Otherwise, no evidence of acute intracranial abnormality.  4. Evidence of prior hemorrhage in the right basal ganglia and  overlying white matter. CXR 03/27/24: No acute cardiopulmonary disease. Type of Study: Bedside Swallow Evaluation Previous Swallow Assessment: MBSS in 2023 Diet Prior to this  Study: NPO Temperature Spikes Noted: No (WBC 13.4) Respiratory Status: Room air History of Recent Intubation: No Behavior/Cognition: Alert;Requires cueing Oral Cavity Assessment: Dry;Dried secretions Oral Care Completed by SLP: Yes Oral Cavity - Dentition: Missing dentition;Poor condition Vision:  (not applicable) Self-Feeding  Abilities: Total assist Patient Positioning: Upright in bed Baseline Vocal Quality: Normal Volitional Cough: Cognitively unable to elicit Volitional Swallow: Unable to elicit    Oral/Motor/Sensory Function Overall Oral Motor/Sensory Function: Moderate impairment (moderate right sided facial/labial weakness with noted edema along right mandible extending to neck) Facial ROM: Reduced left Facial Strength: Reduced right Lingual ROM: Within Functional Limits (reduced extension) Lingual Symmetry: Within Functional Limits Mandible: Within Functional Limits   Ice Chips Ice chips: Within functional limits Presentation: Spoon   Thin Liquid Thin Liquid: Impaired Presentation: Cup Oral Phase Impairments: Reduced labial seal;Poor awareness of bolus Oral Phase Functional Implications: Right anterior spillage Pharyngeal  Phase Impairments: Suspected delayed Swallow;Cough - Immediate    Nectar Thick Nectar Thick Liquid: Impaired Presentation: Spoon;Cup Oral Phase Impairments: Reduced labial seal Oral phase functional implications: Right anterior spillage (loss with cup sip) Pharyngeal Phase Impairments: Cough - Immediate (with cup sip)   Honey Thick Honey Thick Liquid: Not tested   Puree Puree: Impaired Presentation: Spoon Oral Phase Impairments: Reduced lingual movement/coordination Oral Phase Functional Implications: Oral holding;Prolonged oral transit Pharyngeal Phase Impairments:  (no overt indication)   Solid     Solid: Impaired Oral Phase Impairments: Impaired mastication;Poor awareness of bolus Oral Phase Functional Implications: Impaired mastication;Prolonged oral transit;Oral residue Pharyngeal Phase Impairments:  (no indication)     Mark Natane Heward Clapp, MS, CCC-SLP Speech Language Pathologist Rehab Services; [facility] [place] [phone] (ascom)   Mark Shepard 03/28/2024,10:32 AM

## 2024-03-28 NOTE — Progress Notes (Signed)
  Progress Note   Patient: Mark Shepard ZOX:[id number] DOB: Apr 08, 1956 DOA: 03/27/2024     0 DOS: the patient was seen and examined on 03/28/2024   Assessment and Plan: * Altered mental status - Brain MRI negative for acute infarction  but T2 hyperintensity in the right transverse and sigmoid sinus could represent slow flow or thrombus  - CT venogram of head ordered 03/28/2024  Leukocytosis - WBC 16 -->13 - IV ceftriaxone  2 g daily for UTI   Acute ischemic stroke (HCC) Aspirin  81 mg daily, atorvastatin  40 mg every evening, were resumed on admission  Hypertension Amlodipine  10 mg daily, carvedilol  6.25 mg PO bid  Hydralazine  5 mg IV q6h prn SBP > 165, 5 days ordered  History of depression Home fluoxetine  20 mg daily resumed  AKI  - IV 1/2 NS 65 cc/hr   Subjective: Pt seen and examined at the bedside. CT venogram of the head ordered to follow up the brain MRI. IV fluids for Cr elevation. Continue IV antibx as WBC is downtrending.  Physical Exam: Vitals:   03/28/24 0705 03/28/24 0900 03/28/24 1000 03/28/24 1140  BP: 137/81 126/87 (!) 127/97   Pulse: 60 (!) 52    Resp: 13 11 15    Temp: 98.9 F (37.2 C)   98.4 F (36.9 C)  TempSrc: Oral   Oral  SpO2: 100% 100% 91%   Weight:      Height:       Physical Exam Constitutional:      Comments: Awake but drowsy   HENT:     Mouth/Throat:     Mouth: Mucous membranes are dry.  Cardiovascular:     Rate and Rhythm: Bradycardia present.  Pulmonary:     Effort: Pulmonary effort is normal.  Abdominal:     Palpations: Abdomen is soft.  Musculoskeletal:     Cervical back: Neck supple.  Skin:    General: Skin is warm.  Neurological:     Comments: Unable to ascertain   Psychiatric:     Comments: Unable to ascertain        Disposition: Status is: Inpatient Remains inpatient appropriate because: IV fluids and further imaging of brain   Planned Discharge Destination: Barriers to discharge: IV fluids and IV antibx    Time  spent: 35 minutes  Author: Cayci Mcnabb , MD 03/28/2024 12:33 PM  For on call review [email or web address].

## 2024-03-29 ENCOUNTER — Other Ambulatory Visit: Payer: Self-pay

## 2024-03-29 DIAGNOSIS — N1831 Chronic kidney disease, stage 3a: Secondary | ICD-10-CM | POA: Diagnosis not present

## 2024-03-29 DIAGNOSIS — R410 Disorientation, unspecified: Secondary | ICD-10-CM

## 2024-03-29 DIAGNOSIS — I10 Essential (primary) hypertension: Secondary | ICD-10-CM

## 2024-03-29 MED ORDER — ADULT MULTIVITAMIN W/MINERALS CH
1.0000 | ORAL_TABLET | Freq: Every day | ORAL | Status: DC
  Administered 2024-03-29 – 2024-03-31 (×3): 1 via ORAL
  Filled 2024-03-29 (×3): qty 1

## 2024-03-29 NOTE — Progress Notes (Signed)
 Speech Language Pathology Treatment: Dysphagia  Patient Details Name: Mark Shepard MRN: [id number] DOB: 25-Dec-1955 Today's Date: 03/29/2024 Time: [id number] SLP Time Calculation (min) (ACUTE ONLY): 45 min  Assessment / Plan / Recommendation Clinical Impression  Pt appears to present w/ oropharyngeal phase dysphagia in setting of declined Cognitive status; Baseline Cognitive-Communication deficits and Aphasia s/p multiple CVAs per chart. ANY Cognitive decline can impact overall awareness/timing of swallowing and safety during po tasks which increases risk for aspiration, choking.  Pt's risk for aspiration/aspiration pneumonia is present w/ oral intake-- OVERT coughing noted w/ TSP trials of Thin liquids Aspiration risk appears to be reduced when following aspiration precautions and using a modified diet consistency of Dysphagia level 1 w/ Nectar liquids; FULL Feeding Supervision and support. He requires Lengthy Time w/ po intake to clear mouth of trials; Min-mod verbal/visual cues for follow through during po tasks.        Pt consumed trials of purees and Nectar liquids via TSP/cup w/ No overt, clinical s/s of aspiration noted: no cough, vocal quality gravely/low but clear, and no decline in respiratory status during/post trials. Oral phase deficits c/b LENGTHY, deliberate bolus management and oral clearing Time w/ boluses given-- Cognitive decline can impact overall awareness/engagement during po tasks which increases risk for aspiration.    OF NOTE: Immediate, overt clinical s/s of aspiration noted w/ 2/3 TSP trials of thin liquids(coughing). Pt asked for "more water".         In setting of Baseline Cognitive decline and swallowing presentation overall, recommend continue the dysphagia level 1(PUREE foods) moistened well; Nectar liquids by TSP/Cup. Strict aspiration precautions; reduce Distractions during meals and engage pt during meals for self-feeding if able. FULL feeding support and supervision at  meals and check for oral clearing during/post oral intake; alternate bites/sips to aid clearing. Pills Crushed in Puree for safer swallowing as needed. MD/NSG updated.   ST services recommends f/u w/ MBSS(tomorrow) to offer more objective assessment of swallowing and recommendations for POC moving forward. Recommend f/u w/ Palliative Care for GOC(pt is asking for "water") and education re: impact of Cognitive decline on swallowing. Precautions posted in room, chart.         HPI HPI: Pt is a 68 year old male with history of depression, hypertension, nonischemic cardiomyopathy, seizure, history of multiple CVAs, with right-sided weakness, expressive aphasia, history of cocaine use, inadequate social support, who presents ED for chief concerns of mental status change.     Per [facility] where he resides, patient is nonverbal at baseline and usually communicates by squeezing his hands.     And at baseline, patient is able to get up out of bed in the morning. However, he was not able to do so prompting staff to call EMS to take patient to ED for further evaluation. MRI Brain 03/27/24: Partially imaged edema involving the right parotid gland and  surrounding soft tissues, concerning for parotiditis and cellulitis.  Less likely malignancy. Recommend correlation with physical exam and  consider CT of the neck with contrast to better evaluate.  2. T2 hyperintensity in the right transverse and sigmoid sinus could  represent slow flow or thrombus. Recommend postcontrast MRI or CTV  to further evaluate.  3. Otherwise, no evidence of acute intracranial abnormality.  4. Evidence of prior hemorrhage in the right basal ganglia and  overlying white matter. CXR 03/27/24: No acute cardiopulmonary disease.      SLP Plan  Continue with current plan of care;MBS      Recommendations  for follow up therapy are one component of a multi-disciplinary discharge planning process, led by the attending physician.   Recommendations may be updated based on patient status, additional functional criteria and insurance authorization.    Recommendations  Diet recommendations: Dysphagia 1 (puree);Nectar-thick liquid Liquids provided via: Teaspoon;Cup (monitor) Medication Administration: Crushed with puree Supervision: Staff to assist with self feeding;Full supervision/cueing for compensatory strategies Compensations: Minimize environmental distractions;Slow rate;Small sips/bites;Lingual sweep for clearance of pocketing;Multiple dry swallows after each bite/sip;Follow solids with liquid Postural Changes and/or Swallow Maneuvers: Out of bed for meals;Seated upright 90 degrees;Upright 30-60 min after meal                 (Palliative Care for GOC; Dietician) Oral care BID;Oral care before and after PO;Staff/trained caregiver to provide oral care   Frequent or constant Supervision/Assistance Dysphagia, oropharyngeal phase (R13.12) (suspect impact of Cognitive decline-- acute on chronic in nature)     Continue with current plan of care;MBS      Darla Edward, MS, CCC-SLP Speech Language Pathologist Rehab Services; [facility] [phone] (ascom) Mark Shepard  03/29/2024, 4:21 PM

## 2024-03-29 NOTE — TOC Initial Note (Signed)
 Transition of Care [facility]) - Initial/Assessment Note    Patient Details  Name: Mark Shepard MRN: [id number] Date of Birth: 12-Feb-1956  [facility] CM/SW Contact:    Elsie Halo, RN Phone Number: 03/29/2024, 4:24 PM  Clinical Narrative:                 Patient is from LTC at [facility]. Patient is non-verbal. TOC outreached to the patient's cousin, Fredrik Jensen, and didn't get an answer. There was no option to leave a voicemail message.    TOC outreached to Gena [phone] at [facility] who verified the patient is from LTC and has a bed when he is dc'd  TOC will continue to follow for DC planning.  Expected Discharge Plan: Long Term Nursing Home Barriers to Discharge: Continued Medical Work up .  Patient Goals and CMS Choice            Expected Discharge Plan and Services   Discharge Planning Services: CM Consult   Living arrangements for the past 2 months: Skilled Nursing Facility                                      Prior Living Arrangements/Services Living arrangements for the past 2 months: Skilled Nursing Facility Lives with:: Facility Resident                   Activities of Daily Living   ADL Screening (condition at time of admission) Independently performs ADLs?: No Does the patient have a NEW difficulty with bathing/dressing/toileting/self-feeding that is expected to last >3 days?: No Does the patient have a NEW difficulty with getting in/out of bed, walking, or climbing stairs that is expected to last >3 days?: No Does the patient have a NEW difficulty with communication that is expected to last >3 days?: No Is the patient deaf or have difficulty hearing?: No Does the patient have difficulty seeing, even when wearing glasses/contacts?: No Does the patient have difficulty concentrating, remembering, or making decisions?: No  Permission Sought/Granted                  Emotional Assessment           Psych Involvement: No  (comment)  Admission diagnosis:  Altered mental status [R41.82] Altered mental status, unspecified altered mental status type [R41.82] AMS (altered mental status) [R41.82] Patient Active Problem List   Diagnosis Date Noted   AMS (altered mental status) 03/28/2024   Altered mental status 03/27/2024   Leukocytosis 03/27/2024   History of depression 03/27/2024   Acute ischemic stroke (HCC) 06/11/2021   Encounter for medical examination to establish care 11/23/2020   Lumbar radiculopathy 11/23/2020   Chronic midline low back pain without sciatica 10/03/2019   Cervical myelopathy (HCC) 05/09/2019   Primary osteoarthritis involving multiple joints 06/03/2017   Alcohol use disorder, severe, in early remission (HCC) 05/29/2016   CKD stage 3a, GFR 45-59 ml/min (HCC) 06/28/2015   Cardiomyopathy (HCC) 05/19/2014   ICH (intracerebral hemorrhage) (HCC) 11/07/2013   Hypertension 07/06/2013   PCP:  Donnalee Gab Pharmacy:   [facility] - [place], [facility] - [address] [place] [address] Phone: [phone] Fax: [phone]     Social Drivers of Health (SDOH) Social History: SDOH Screenings   Food Insecurity: No Food Insecurity (03/28/2024)  Housing: Low Risk  (03/28/2024)  Transportation Needs: No Transportation Needs (03/28/2024)  Utilities: Not At Risk (03/28/2024)  Financial Resource Strain: Medium Risk (12/12/2020)   [facility] from [facility]  Social Connections: Socially Isolated (03/28/2024)  Tobacco Use: Medium Risk (03/27/2024)  Health Literacy: Low Risk  (12/12/2020)   [facility] from [facility], [facility]   SDOH Interventions:     Readmission Risk Interventions     No data to display

## 2024-03-29 NOTE — Progress Notes (Signed)
  Progress Note   Patient: Mark Shepard WRU:[id number] DOB: 23-Sep-1956 DOA: 03/27/2024     1 DOS: the patient was seen and examined on 03/29/2024   Brief hospital course: Mark Shepard is a 68 year old male with history of depression, hypertension, nonischemic cardiomyopathy, seizure, history of multiple CVA, with right-sided weakness, expressive aphasia, history of cocaine use, inadequate social support, who presents ED for chief concerns of mental status change.  Per [facility], patient is nonverbal at baseline and usually communicates by squeezing his hands.  And at baseline, patient is able to get up out of bed in the morning. However, he was not able to do so prompting staff to call EMS to take patient to ED for further evaluation.     Principal Problem:   Altered mental status Active Problems:   Acute ischemic stroke (HCC)   Leukocytosis   Cardiomyopathy (HCC)   Hypertension   History of depression   CKD stage 3a, GFR 45-59 ml/min (HCC)   ICH (intracerebral hemorrhage) (HCC)   Lumbar radiculopathy   AMS (altered mental status)   Assessment and Plan: * Altered mental status Acute ischemic stroke (HCC) ruled out History of stroke with expressive aphasia and dysphagia. Bedbound status. Etiology of altered mental status is unclear.  Patient has history of stroke with aphasia and dysphagia.  MRI did not show any acute stroke.  Suspect sigmoid sinus thrombosis which was ruled out by CT venogram. Patient was speech therapist, showed significant dysphagia, high risk for aspiration.  Placed on dysphagia 1 diet modified barium swallow was obtained. Patient currently does not have aspiration pneumonia.  Leukocytosis Possible parotiditis. Patient MRI scan showed evidence of parotiditis, UA does not support urinary tract infection.  Blood cultures so far negative.  Rocephin  was started, will continue.  Chronic kidney disease stage IIIa. Acute kidney injury ruled  out. Hypernatremia. Renal function still stable, patient does not have acute kidney injury.  Patient has a mild hypernatremia, will follow-up with increase p.o. intake.  Hypertension Home medicines.  History of depression Home fluoxetine  20 mg daily resumed       Subjective:  Patient is aphasic.  No shortness of breath or cough.  Physical Exam: Vitals:   03/29/24 0043 03/29/24 0409 03/29/24 0915 03/29/24 1132  BP: 114/85 (!) 128/91 (!) 129/92 120/77  Pulse: (!) 55 (!) 58 (!) 58 (!) 53  Resp: 17 18 18 18   Temp: 97.7 F (36.5 C) 98.4 F (36.9 C) 98.8 F (37.1 C) 98.5 F (36.9 C)  TempSrc: Oral Oral Oral Oral  SpO2: 96% 95% 98% 97%  Weight:      Height:       General exam: Appears calm and comfortable  Respiratory system: Clear to auscultation. Respiratory effort normal. Cardiovascular system: S1 & S2 heard, RRR. No JVD, murmurs, rubs, gallops or clicks. No pedal edema. Gastrointestinal system: Abdomen is nondistended, soft and nontender. No organomegaly or masses felt. Normal bowel sounds heard. Central nervous system: Alert and oriented.  Aphasia Extremities: Symmetric 5 x 5 power. Skin: No rashes, lesions or ulcers Psychiatry: Flat affect   Data Reviewed:  Reviewed MRI results, CT scan results and lab results.  Family Communication: Called listed numbers for cousin and friend, no response.  Disposition: Status is: Inpatient Remains inpatient appropriate because: Altered mental status, IV antibiotics.     Time spent: 50 minutes  Author: Donaciano Frizzle, MD 03/29/2024 11:45 AM  For on call review [email or web address].

## 2024-03-29 NOTE — Progress Notes (Signed)
 Initial Nutrition Assessment  DOCUMENTATION CODES:   Severe malnutrition in context of chronic illness  INTERVENTION:   -MVI with minerals daily -Nepro Shake po TID, each supplement provides 425 kcal and 19 grams protein  -Magic cup TID with meals, each supplement provides 290 kcal and 9 grams of protein  -Feeding assistance with meals  NUTRITION DIAGNOSIS:   Severe Malnutrition related to chronic illness (CVA, cocaine use) as evidenced by severe muscle depletion, severe fat depletion.  GOAL:   Patient will meet greater than or equal to 90% of their needs  MONITOR:   PO intake, Supplement acceptance, Diet advancement  REASON FOR ASSESSMENT:   Consult Assessment of nutrition requirement/status  ASSESSMENT:   Pt with history of depression, hypertension, nonischemic cardiomyopathy, seizure, history of multiple CVA, with right-sided weakness, expressive aphasia, history of cocaine use, inadequate social support, who presents for chief concerns of mental status change.  Pt admitted with AMS.    4/21- s/p BSE- dysphagia 1 diet with nectar thick liquids  Reviewed I/O's: +704 ml x 24 hours  Per MD notes, brain MRI negative for acute infarction. CT venogram of head ordered yesterday.   Case discussed SLP, who is concerned about poor oral intake. Pt was just advanced to a dysphagia 1 diet with nectar thick liquids yesterday. Observed breakfast tray, which was unattempted. No meal completion data available to assess at this time.   Pt sitting up bed at time of visit. Pt opened eyes and shook head when interacting with this RD, but unable to provide history. NO family at bedside.   Pt is from [facility]. Per wt hx, pt has experienced 21.5% wt loss from 12/29/23 (132.6#)-02/23/24 (104.1#). This is significant for time frame.   Noted distant history of weight loss.   Medications reviewed and include senokot and rocephin .   Labs reviewed: Na: 146.    NUTRITION - FOCUSED  PHYSICAL EXAM:  Flowsheet Row Most Recent Value  Orbital Region Severe depletion  Upper Arm Region Severe depletion  Thoracic and Lumbar Region Severe depletion  Buccal Region Severe depletion  Temple Region Severe depletion  Clavicle Bone Region Severe depletion  Clavicle and Acromion Bone Region Severe depletion  Scapular Bone Region Severe depletion  Dorsal Hand Severe depletion  Patellar Region Severe depletion  Anterior Thigh Region Severe depletion  Posterior Calf Region Severe depletion  Edema (RD Assessment) None  Hair Reviewed  Eyes Reviewed  Mouth Reviewed  Skin Reviewed  Nails Reviewed       Diet Order:   Diet Order             DIET - DYS 1 Room service appropriate? Yes; Fluid consistency: Nectar Thick  Diet effective now                   EDUCATION NEEDS:   Not appropriate for education at this time  Skin:  Skin Assessment: Reviewed RN Assessment  Last BM:  03/28/24  Height:   Ht Readings from Last 1 Encounters:  03/27/24 5\' 6"  (1.676 m)    Weight:   Wt Readings from Last 1 Encounters:  03/27/24 54.9 kg    Ideal Body Weight:  64.5 kg  BMI:  Body mass index is 19.55 kg/m.  Estimated Nutritional Needs:   Kcal:  1700-1900  Protein:  90-105 grams  Fluid:  > 1.7 L    Herschel Lords, RD, LDN, CDCES Registered Dietitian III Certified Diabetes Care and Education Specialist If unable to reach this RD, please  use "RD Inpatient" group chat on secure chat between hours of 8am-4 pm daily

## 2024-03-30 ENCOUNTER — Inpatient Hospital Stay

## 2024-03-30 DIAGNOSIS — R410 Disorientation, unspecified: Secondary | ICD-10-CM | POA: Diagnosis not present

## 2024-03-30 DIAGNOSIS — E43 Unspecified severe protein-calorie malnutrition: Secondary | ICD-10-CM | POA: Insufficient documentation

## 2024-03-30 LAB — BASIC METABOLIC PANEL WITH GFR
Anion gap: 7 (ref 5–15)
BUN: 25 mg/dL — ABNORMAL HIGH (ref 8–23)
CO2: 25 mmol/L (ref 22–32)
Calcium: 8.5 mg/dL — ABNORMAL LOW (ref 8.9–10.3)
Chloride: 114 mmol/L — ABNORMAL HIGH (ref 98–111)
Creatinine, Ser: 0.94 mg/dL (ref 0.61–1.24)
GFR, Estimated: >60 mL/min (ref >60)
Glucose, Bld: 97 mg/dL (ref 70–99)
Potassium: 3.4 mmol/L — ABNORMAL LOW (ref 3.5–5.1)
Sodium: 146 mmol/L — ABNORMAL HIGH (ref 135–145)

## 2024-03-30 LAB — URINE DRUG SCREEN, QUALITATIVE (ARMC ONLY)
Amphetamines, Ur Screen: NOT DETECTED
Barbiturates, Ur Screen: NOT DETECTED
Benzodiazepine, Ur Scrn: NOT DETECTED
Cannabinoid 50 Ng, Ur ~~LOC~~: NOT DETECTED
Cocaine Metabolite,Ur ~~LOC~~: NOT DETECTED
MDMA (Ecstasy)Ur Screen: NOT DETECTED
Methadone Scn, Ur: NOT DETECTED
Opiate, Ur Screen: NOT DETECTED
Phencyclidine (PCP) Ur S: NOT DETECTED
Tricyclic, Ur Screen: NOT DETECTED

## 2024-03-30 LAB — MAGNESIUM: Magnesium: 2.3 mg/dL (ref 1.7–2.4)

## 2024-03-30 LAB — PHOSPHORUS: Phosphorus: 1.7 mg/dL — ABNORMAL LOW (ref 2.5–4.6)

## 2024-03-30 MED ORDER — POTASSIUM PHOSPHATES 15 MMOLE/5ML IV SOLN
30.0000 mmol | Freq: Once | INTRAVENOUS | Status: AC
  Administered 2024-03-30: 30 mmol via INTRAVENOUS
  Filled 2024-03-30: qty 10

## 2024-03-30 NOTE — Plan of Care (Signed)
  Problem: Health Behavior/Discharge Planning: Goal: Ability to manage health-related needs will improve Outcome: Progressing   Problem: Clinical Measurements: Goal: Will remain free from infection Outcome: Progressing Goal: Cardiovascular complication will be avoided Outcome: Progressing   Problem: Coping: Goal: Level of anxiety will decrease Outcome: Progressing

## 2024-03-30 NOTE — Progress Notes (Signed)
 Progress Note    Mark Shepard  ZOX:[id number] DOB: 09/23/1956  DOA: 03/27/2024 PCP: Donnalee Gab      Brief Narrative:    Medical records reviewed and are as summarized below:  Mark Shepard is a 68 year old male with history of depression, hypertension, nonischemic cardiomyopathy, seizure, history of multiple CVA, with right-sided weakness, expressive aphasia, history of cocaine use, inadequate social support, who presented to ED with change in mental status.  Per [facility], patient is nonverbal at baseline and usually communicates by squeezing his hands.  And at baseline, patient is able to get up out of bed in the morning. However, he was not able to do so prompting staff to call EMS to take patient to ED for further evaluation.        Assessment/Plan:   Principal Problem:   Altered mental status Active Problems:   Acute ischemic stroke (HCC)   Leukocytosis   Cardiomyopathy (HCC)   Hypertension   History of depression   CKD stage 3a, GFR 45-59 ml/min (HCC)   ICH (intracerebral hemorrhage) (HCC)   Lumbar radiculopathy   AMS (altered mental status)   Protein-calorie malnutrition, severe   Nutrition Problem: Severe Malnutrition Etiology: chronic illness (CVA, cocaine use)  Signs/Symptoms: severe muscle depletion, severe fat depletion, percent weight loss   Body mass index is 19.55 kg/m.   Altered mental status Acute ischemic stroke (HCC) ruled out History of stroke with expressive aphasia and dysphagia. Bedbound status. Etiology of altered mental status is unclear.  Patient has history of stroke with aphasia and dysphagia.  MRI did not show any acute stroke.   Speech therapist recommended dysphagia 1 diet for dysphagia after MBS.     Leukocytosis Possible right parotiditis. WBC trending down, 16-13.4. Continue IV ceftriaxone . Patient MRI scan showed evidence of parotiditis, UA does not support urinary tract infection.  Blood cultures so  far negative.    Chronic kidney disease stage IIIa. Acute kidney injury ruled out. Mild hypernatremia Hypokalemia Hypophosphatemia Sodium 146, potassium 3.4 and phosphorus 1.7.   Replete potassium and phosphorus with IV potassium phosphate . Renal function is stable.  Patient does not have acute kidney injury.  Encourage adequate oral intake    Hypertension Continue amlodipine  and carvedilol     History of depression Continue fluoxetine          Diet Order             DIET - DYS 1 Room service appropriate? Yes; Fluid consistency: Nectar Thick  Diet effective now                            Consultants: None  Procedures: None    Medications:    amLODipine   10 mg Oral Daily   aspirin  EC  81 mg Oral Daily   atorvastatin   40 mg Oral QPM   carvedilol   6.25 mg Oral BID WC   feeding supplement (NEPRO CARB STEADY)  237 mL Oral TID BM   FLUoxetine   20 mg Oral Daily   heparin   5,000 Units Subcutaneous Q8H   multivitamin with minerals  1 tablet Oral Daily   senna  2 tablet Oral QHS   Continuous Infusions:  cefTRIAXone  (ROCEPHIN )  IV 2 g (03/29/24 1816)     Anti-infectives (From admission, onward)    Start     Dose/Rate Route Frequency Ordered Stop   03/27/24 1800  cefTRIAXone  (ROCEPHIN ) 2 g in sodium chloride  0.9 % 100  mL IVPB        2 g 200 mL/hr over 30 Minutes Intravenous Every 24 hours 03/27/24 1645 04/01/24 1759              Family Communication/Anticipated D/C date and plan/Code Status   DVT prophylaxis: heparin  injection 5,000 Units Start: 03/27/24 2200 Place TED hose Start: 03/27/24 1528     Code Status: Limited: Do not attempt resuscitation (DNR) -DNR-LIMITED -Do Not Intubate/DNI   Family Communication: None Disposition Plan: Plans discharge to long-term care facility   Status is: Inpatient Remains inpatient appropriate because: Parotiditis, electrolyte abnormalities       Subjective:   Interval events noted.  He  is nonverbal and unable to provide any history  Objective:    Vitals:   03/30/24 0003 03/30/24 0554 03/30/24 0725 03/30/24 1139  BP: 123/76 (!) 144/98 136/85 100/73  Pulse: 66 (!) 56 62 (!) 55  Resp: 18 16 16 17   Temp: 97.6 F (36.4 C) 98.1 F (36.7 C) 97.6 F (36.4 C) 97.7 F (36.5 C)  TempSrc: Axillary     SpO2: 98% 100% 100% 100%  Weight:      Height:       No data found.  No intake or output data in the 24 hours ending 03/30/24 1332 Filed Weights   03/27/24 1247  Weight: 54.9 kg    Exam:  GEN: NAD SKIN: Warm and dry EYES: No pallor or icterus ENT: MMM, swelling of the right parotid gland/right jaw without erythema or tenderness CV: RRR PULM: CTA B ABD: soft, ND, NT, +BS CNS: Drowsy but arousable.  Nonverbal.  He does not follow commands EXT: No edema or tenderness        Data Reviewed:   I have personally reviewed following labs and imaging studies:  Labs: Labs show the following:   Basic Metabolic Panel: Recent Labs  Lab 03/27/24 1251 03/28/24 0509 03/30/24 0354  NA 144 146* 146*  K 3.9 3.7 3.4*  CL 110 110 114*  CO2 26 25 25   GLUCOSE 77 73 97  BUN 43* 37* 25*  CREATININE 1.40* 1.26* 0.94  CALCIUM  8.7* 8.7* 8.5*  MG  --   --  2.3  PHOS  --   --  1.7*   GFR Estimated Creatinine Clearance: 60.8 mL/min (by C-G formula based on SCr of 0.94 mg/dL). Liver Function Tests: Recent Labs  Lab 03/27/24 1251  AST 25  ALT 27  ALKPHOS 45  BILITOT 0.6  PROT 6.7  ALBUMIN 2.4*   No results for input(s): "LIPASE", "AMYLASE" in the last 168 hours. No results for input(s): "AMMONIA" in the last 168 hours. Coagulation profile No results for input(s): "INR", "PROTIME" in the last 168 hours.  CBC: Recent Labs  Lab 03/27/24 1251 03/28/24 0509  WBC 16.0* 13.4*  NEUTROABS 11.6*  --   HGB 12.2* 11.7*  HCT 38.5* 36.4*  MCV 95.5 93.8  PLT 352 342   Cardiac Enzymes: No results for input(s): "CKTOTAL", "CKMB", "CKMBINDEX", "TROPONINI" in the  last 168 hours. BNP (last 3 results) No results for input(s): "PROBNP" in the last 8760 hours. CBG: No results for input(s): "GLUCAP" in the last 168 hours. D-Dimer: No results for input(s): "DDIMER" in the last 72 hours. Hgb A1c: No results for input(s): "HGBA1C" in the last 72 hours. Lipid Profile: No results for input(s): "CHOL", "HDL", "LDLCALC", "TRIG", "CHOLHDL", "LDLDIRECT" in the last 72 hours. Thyroid function studies: No results for input(s): "TSH", "T4TOTAL", "T3FREE", "THYROIDAB" in the last  72 hours.  Invalid input(s): "FREET3" Anemia work up: No results for input(s): "VITAMINB12", "FOLATE", "FERRITIN", "TIBC", "IRON", "RETICCTPCT" in the last 72 hours. Sepsis Labs: Recent Labs  Lab 03/27/24 1251 03/27/24 1549 03/28/24 0509  PROCALCITON 0.19  --   --   WBC 16.0*  --  13.4*  LATICACIDVEN 1.1 1.2  --     Microbiology Recent Results (from the past 240 hours)  Resp panel by RT-PCR (RSV, Flu A&B, Covid) Anterior Nasal Swab     Status: None   Collection Time: 03/27/24 12:51 PM   Specimen: Anterior Nasal Swab  Result Value Ref Range Status   SARS Coronavirus 2 by RT PCR NEGATIVE NEGATIVE Final    Comment: (NOTE) SARS-CoV-2 target nucleic acids are NOT DETECTED.  The SARS-CoV-2 RNA is generally detectable in upper respiratory specimens during the acute phase of infection. The lowest concentration of SARS-CoV-2 viral copies this assay can detect is 138 copies/mL. A negative result does not preclude SARS-Cov-2 infection and should not be used as the sole basis for treatment or other patient management decisions. A negative result may occur with  improper specimen collection/handling, submission of specimen other than nasopharyngeal swab, presence of viral mutation(s) within the areas targeted by this assay, and inadequate number of viral copies(<138 copies/mL). A negative result must be combined with clinical observations, patient history, and  epidemiological information. The expected result is Negative.  Fact Sheet for Patients:  BloggerCourse.com  Fact Sheet for Healthcare Providers:  SeriousBroker.it  This test is no t yet approved or cleared by the [place]  FDA and  has been authorized for detection and/or diagnosis of SARS-CoV-2 by FDA under an Emergency Use Authorization (EUA). This EUA will remain  in effect (meaning this test can be used) for the duration of the COVID-19 declaration under Section 564(b)(1) of the Act, 21 U.S.C.section 360bbb-3(b)(1), unless the authorization is terminated  or revoked sooner.       Influenza A by PCR NEGATIVE NEGATIVE Final   Influenza B by PCR NEGATIVE NEGATIVE Final    Comment: (NOTE) The Xpert Xpress SARS-CoV-2/FLU/RSV plus assay is intended as an aid in the diagnosis of influenza from Nasopharyngeal swab specimens and should not be used as a sole basis for treatment. Nasal washings and aspirates are unacceptable for Xpert Xpress SARS-CoV-2/FLU/RSV testing.  Fact Sheet for Patients: BloggerCourse.com  Fact Sheet for Healthcare Providers: SeriousBroker.it  This test is not yet approved or cleared by the [place]  FDA and has been authorized for detection and/or diagnosis of SARS-CoV-2 by FDA under an Emergency Use Authorization (EUA). This EUA will remain in effect (meaning this test can be used) for the duration of the COVID-19 declaration under Section 564(b)(1) of the Act, 21 U.S.C. section 360bbb-3(b)(1), unless the authorization is terminated or revoked.     Resp Syncytial Virus by PCR NEGATIVE NEGATIVE Final    Comment: (NOTE) Fact Sheet for Patients: BloggerCourse.com  Fact Sheet for Healthcare Providers: SeriousBroker.it  This test is not yet approved or cleared by the [place]  FDA and has been  authorized for detection and/or diagnosis of SARS-CoV-2 by FDA under an Emergency Use Authorization (EUA). This EUA will remain in effect (meaning this test can be used) for the duration of the COVID-19 declaration under Section 564(b)(1) of the Act, 21 U.S.C. section 360bbb-3(b)(1), unless the authorization is terminated or revoked.  Performed at [facility], [address] [place], [place] [address]   Blood culture (single)     Status: None (Preliminary  result)   Collection Time: 03/27/24 12:56 PM   Specimen: BLOOD  Result Value Ref Range Status   Specimen Description BLOOD RIGHT ANTECUBITAL  Final   Special Requests   Final    BLOOD Blood Culture results may not be optimal due to an inadequate volume of blood received in culture bottles   Culture   Final    NO GROWTH 3 DAYS Performed at [facility], [address], [place], [place] [address]    Report Status PENDING  Incomplete  Culture, blood (single) w Reflex to ID Panel     Status: None (Preliminary result)   Collection Time: 03/27/24  3:49 PM   Specimen: BLOOD  Result Value Ref Range Status   Specimen Description BLOOD LEFT ANTECUBITAL  Final   Special Requests   Final    BOTTLES DRAWN AEROBIC AND ANAEROBIC Blood Culture results may not be optimal due to an inadequate volume of blood received in culture bottles   Culture   Final    NO GROWTH 3 DAYS Performed at [facility], [address] [place], [place] [address]    Report Status PENDING  Incomplete    Procedures and diagnostic studies:  DG Swallowing Func-Speech Pathology Result Date: 03/30/2024 CLINICAL DATA:  Dysphagia. Cough/GE reflux disease/other secondary diagnosis EXAM: MODIFIED BARIUM SWALLOW TECHNIQUE: Different consistencies of barium were administered orally to the patient by the Speech Pathologist. Imaging of the pharynx was performed in the lateral projection. Radiologist, not in attendance for the exam. Different  consistencies of barium were administered orally to the patient by the Speech Pathologist. Imaging of the pharynx was performed in the lateral projection. The radiologist was present in the fluoroscopy room for this study, providing personal supervision. FLUOROSCOPY TIME:  Radiation Exposure Index (as provided by the fluoroscopic device): 3 minutes 48 seconds 19.70 mGy COMPARISON:  None Available. FINDINGS: Modified barium swallow was performed by the speech pathologist. Radiologist was not involved with this exam. Please refer to the Speech Pathology report for results and recommendations. IMPRESSION: Please refer to the Speech Pathologists report for complete details and recommendations. Electronically Signed   By: Bettylou Brunner M.D.   On: 03/30/2024 11:16   CT VENOGRAM HEAD Result Date: 03/28/2024 CLINICAL DATA:  Altered mental status EXAM: CT VENOGRAM HEAD TECHNIQUE: Venographic phase images of the brain were obtained following the administration of intravenous contrast. Multiplanar reformats and maximum intensity projections were generated. RADIATION DOSE REDUCTION: This exam was performed according to the departmental dose-optimization program which includes automated exposure control, adjustment of the mA and/or kV according to patient size and/or use of iterative reconstruction technique. CONTRAST:  75mL OMNIPAQUE  IOHEXOL  350 MG/ML SOLN COMPARISON:  None Available. FINDINGS: Brain: There is no mass, hemorrhage or extra-axial collection. There is generalized atrophy without lobar predilection. Hypodensity of the white matter is most commonly associated with chronic microvascular disease. Vascular: No hyperdense vessel or unexpected vascular calcification. Superior sagittal sinus: Normal. Straight sinus: Normal. Inferior sagittal sinus, vein of Galen and internal cerebral veins: Normal. Transverse sinuses: Normal. Sigmoid sinuses: Normal. Visualized jugular veins: Normal. Skull: The visualized skull base,  calvarium and extracranial soft tissues are normal. Sinuses/Orbits: No fluid levels or advanced mucosal thickening of the visualized paranasal sinuses. No mastoid or middle ear effusion. Normal orbits. Other: None. IMPRESSION: 1. No evidence of dural venous sinus thrombosis. 2. Generalized atrophy and findings of chronic microvascular disease. Electronically Signed   By: Juanetta Nordmann M.D.   On: 03/28/2024 21:19  LOS: 2 days   Asif Muchow  [facility]   Pager on [email or web address]. If 7PM-7AM, please contact night-coverage at [email or web address]     03/30/2024, 1:32 PM

## 2024-03-30 NOTE — Procedures (Signed)
 Modified Barium Swallow Study  Patient Details  Name: Mark Shepard MRN: [id number] Date of Birth: 1956-08-25  Today's Date: 03/30/2024  Modified Barium Swallow completed.  Full report located under Chart Review in the Imaging Section.  History of Present Illness Pt is a 67 year old male with history of depression, hypertension, nonischemic cardiomyopathy, seizure, history of multiple CVAs, with right-sided weakness, expressive aphasia, history of cocaine use, inadequate social support, who presents ED for chief concerns of mental status change.     Per [facility] where he resides, patient is nonverbal at baseline and usually communicates by squeezing his hands.     And at baseline, patient is able to get up out of bed in the morning. However, he was not able to do so prompting staff to call EMS to take patient to ED for further evaluation. MRI Brain 03/27/24: Partially imaged edema involving the right parotid gland and  surrounding soft tissues, concerning for parotiditis and cellulitis.  Less likely malignancy. Recommend correlation with physical exam and  consider CT of the neck with contrast to better evaluate.  2. T2 hyperintensity in the right transverse and sigmoid sinus could  represent slow flow or thrombus. Recommend postcontrast MRI or CTV  to further evaluate.  3. Otherwise, no evidence of acute intracranial abnormality.  4. Evidence of prior hemorrhage in the right basal ganglia and  overlying white matter. CXR 03/27/24: No acute cardiopulmonary disease.   Clinical Impression   Pt presents with head forward position making it difficult to take sips from a cup.   Slow almost passive posterior movement of boluses with boluses resting in the vallecula prior to swallow initiation. However, when swallow was initiation his epiglottis deflected covering airway with good airway protection when consuming nectar thick liquids via straw (even with multiple swallows per straw), puree and  graham cracker with barium paste. Silent aspiraiton observed with thin liquids. At this time, however, would not recommend dysphagia 2 diet d/t decreased lingual manipulation of bolus > 1 minute resulting in increased risk of aspiration. At this time continue dysphagia 1 diet with nectar thick liquids VIA STRAW, medicine crushed in puree. Skilled ST services are no longer indicated.    Factors that may increase risk of adverse event in presence of aspiration Roderick Civatte & Jessy Morocco 2021): Poor general health and/or compromised immunity;Reduced cognitive function;Limited mobility;Frail or deconditioned;Dependence for feeding and/or oral hygiene  Swallow Evaluation Recommendations Recommendations: PO diet PO Diet Recommendation: Dysphagia 1 (Pureed);Mildly thick liquids (Level 2, nectar thick) Liquid Administration via: Straw Medication Administration: Crushed with puree Supervision: Full assist for feeding Swallowing strategies  : Minimize environmental distractions;Slow rate;Small bites/sips Postural changes: Position pt fully upright for meals;Stay upright 30-60 min after meals Oral care recommendations: Oral care BID (2x/day) Caregiver Recommendations: Avoid jello, ice cream, thin soups, popsicles;Remove water pitcher;Have oral suction available     Chastin Riesgo B. Garlin Junker, M.S., CCC-SLP, Tree surgeon Certified Brain Injury Specialist [facility]  [facility] Office [phone] Ascom [phone] Fax [phone]

## 2024-03-30 NOTE — Plan of Care (Signed)

## 2024-03-31 DIAGNOSIS — R410 Disorientation, unspecified: Secondary | ICD-10-CM | POA: Diagnosis not present

## 2024-03-31 DIAGNOSIS — K1120 Sialoadenitis, unspecified: Secondary | ICD-10-CM | POA: Diagnosis present

## 2024-03-31 LAB — CBC
HCT: 37.7 % — ABNORMAL LOW (ref 39.0–52.0)
Hemoglobin: 12.6 g/dL — ABNORMAL LOW (ref 13.0–17.0)
MCH: 29.7 pg (ref 26.0–34.0)
MCHC: 33.4 g/dL (ref 30.0–36.0)
MCV: 88.9 fL (ref 80.0–100.0)
Platelets: 457 10*3/uL — ABNORMAL HIGH (ref 150–400)
RBC: 4.24 MIL/uL (ref 4.22–5.81)
RDW: 13.5 % (ref 11.5–15.5)
WBC: 8.4 10*3/uL (ref 4.0–10.5)
nRBC: 0 % (ref 0.0–0.2)

## 2024-03-31 LAB — RENAL FUNCTION PANEL
Albumin: 2.5 g/dL — ABNORMAL LOW (ref 3.5–5.0)
Anion gap: 7 (ref 5–15)
BUN: 19 mg/dL (ref 8–23)
CO2: 23 mmol/L (ref 22–32)
Calcium: 8.6 mg/dL — ABNORMAL LOW (ref 8.9–10.3)
Chloride: 111 mmol/L (ref 98–111)
Creatinine, Ser: 0.92 mg/dL (ref 0.61–1.24)
GFR, Estimated: >60 mL/min (ref >60)
Glucose, Bld: 109 mg/dL — ABNORMAL HIGH (ref 70–99)
Phosphorus: 2.1 mg/dL — ABNORMAL LOW (ref 2.5–4.6)
Potassium: 3.4 mmol/L — ABNORMAL LOW (ref 3.5–5.1)
Sodium: 141 mmol/L (ref 135–145)

## 2024-03-31 MED ORDER — AMOXICILLIN-POT CLAVULANATE 875-125 MG PO TABS
1.0000 | ORAL_TABLET | Freq: Two times a day (BID) | ORAL | Status: DC
  Administered 2024-03-31: 1 via ORAL
  Filled 2024-03-31: qty 1

## 2024-03-31 MED ORDER — K PHOS MONO-SOD PHOS DI & MONO 155-852-130 MG PO TABS: 250.0000 mg | ORAL_TABLET | Freq: Two times a day (BID) | ORAL | Status: AC

## 2024-03-31 MED ORDER — K PHOS MONO-SOD PHOS DI & MONO 155-852-130 MG PO TABS
500.0000 mg | ORAL_TABLET | Freq: Three times a day (TID) | ORAL | Status: DC
  Administered 2024-03-31: 500 mg via ORAL
  Filled 2024-03-31: qty 2

## 2024-03-31 MED ORDER — AMOXICILLIN-POT CLAVULANATE 875-125 MG PO TABS: 1.0000 | ORAL_TABLET | Freq: Two times a day (BID) | ORAL | Status: AC

## 2024-03-31 NOTE — Plan of Care (Signed)
  Problem: Health Behavior/Discharge Planning: Goal: Ability to manage health-related needs will improve Outcome: Progressing   Problem: Clinical Measurements: Goal: Diagnostic test results will improve Outcome: Progressing   Problem: Elimination: Goal: Will not experience complications related to bowel motility Outcome: Progressing   Problem: Safety: Goal: Ability to remain free from injury will improve Outcome: Progressing

## 2024-03-31 NOTE — Discharge Summary (Addendum)
 Physician Discharge Summary   Patient: Mark Shepard MRN: [id number] DOB: 05-07-1956  Admit date:     03/27/2024  Discharge date: 03/31/24  Discharge Physician: Sheril Dines   PCP: Donnalee Gab   Recommendations at discharge:   Follow-up with PCP in 1 week   Discharge Diagnoses: Principal Problem:   Altered mental status Active Problems:   Acute ischemic stroke (HCC)   Leukocytosis   Cardiomyopathy (HCC)   Hypertension   History of depression   CKD stage 3a, GFR 45-59 ml/min (HCC)   ICH (intracerebral hemorrhage) (HCC)   Lumbar radiculopathy   AMS (altered mental status)   Protein-calorie malnutrition, severe   Parotiditis, right  Resolved Problems:   * No resolved hospital problems. [facility] Course:  Mark Shepard is a 68 year old male with history of depression, hypertension, nonischemic cardiomyopathy, seizure, history of multiple CVA, with right-sided weakness, expressive aphasia, history of cocaine use, inadequate social support, who presented to ED with change in mental status.   Per [facility], patient is nonverbal at baseline and usually communicates by squeezing his hands.  And at baseline, patient is able to get up out of bed in the morning. However, he was not able to do so prompting staff to call EMS to take patient to ED for further evaluation.     Assessment and Plan:   Altered mental status Acute ischemic stroke (HCC) ruled out History of stroke with expressive aphasia and dysphagia. Bedbound status. Etiology of altered mental status is unclear.  Patient has history of stroke with aphasia and dysphagia.  MRI did not show any acute stroke.   Speech therapist recommended dysphagia 1 diet for dysphagia after MBS.      Leukocytosis Right parotiditis. Leukocytosis has resolved. Received 4 doses of IV ceftriaxone . He will be discharged on 6 days of Augmentin . Right jaw swelling appears to be improving. Patient MRI scan showed evidence of  parotiditis, UA does not support urinary tract infection.  Blood cultures so far negative.     Chronic kidney disease stage IIIa. Acute kidney injury ruled out. Mild hypernatremia Hypokalemia Hypophosphatemia Potassium 3.4, phosphorus 2.1.  Discharged on oral potassium phosphate . S/p treatment with IV potassium phosphate  infusion. Hyponatremia has improved. Patient does not have acute kidney injury.  Encourage adequate oral intake     Hypertension Continue amlodipine  and carvedilol      History of depression Continue fluoxetine      He is medically stable for discharge to the long-term care facility. I called his cousin, Izaya Netherton, at 11:15 AM today but there was no response.  I left a voice message for him to call back.       Consultants: None Procedures performed: None Disposition: Long term care facility Diet recommendation:  Discharge Diet Orders (From admission, onward)     Start     Ordered   03/31/24 0000  Diet - low sodium heart healthy        03/31/24 1116           Dysphagia type 1 nectar thick Liquid DISCHARGE MEDICATION: Allergies as of 03/31/2024   No Known Allergies      Medication List     STOP taking these medications    cefTRIAXone  2 g injection Commonly known as: ROCEPHIN    gabapentin  100 MG capsule Commonly known as: NEURONTIN    gabapentin  400 MG capsule Commonly known as: NEURONTIN    lidocaine  5 % Commonly known as: LIDODERM    moxifloxacin  0.5 % ophthalmic solution Commonly known as:  VIGAMOX    traMADol-acetaminophen  37.5-325 MG tablet Commonly known as: ULTRACET   triamcinolone acetonide 40 MG/ML injection Commonly known as: KENALOG-40       TAKE these medications    acetaminophen  500 MG tablet Commonly known as: TYLENOL  Take 1,000 mg by mouth every 6 (six) hours as needed for mild pain (pain score 1-3) or moderate pain (pain score 4-6).   albuterol 108 (90 Base) MCG/ACT inhaler Commonly known as: VENTOLIN  HFA Inhale into the lungs.   amLODipine  10 MG tablet Commonly known as: NORVASC  Take 10 mg by mouth daily.   amoxicillin -clavulanate 875-125 MG tablet Commonly known as: AUGMENTIN  Take 1 tablet by mouth every 12 (twelve) hours for 6 days.   aspirin  81 MG chewable tablet Chew 81 mg by mouth daily.   atorvastatin  20 MG tablet Commonly known as: LIPITOR Take 20 mg by mouth daily.   Baclofen 5 MG Tabs Take 1 tablet by mouth 3 (three) times daily.   carvedilol  6.25 MG tablet Commonly known as: COREG  Take 6.25 mg by mouth 2 (two) times daily.   cetirizine 5 MG tablet Commonly known as: ZYRTEC Take 5 mg by mouth daily.   diclofenac Sodium 1 % Gel Commonly known as: VOLTAREN Apply 2 g topically 4 (four) times daily. (Apply to right shoulder and left knee)   divalproex 125 MG capsule Commonly known as: DEPAKOTE SPRINKLE Take 125 mg by mouth 4 (four) times daily. Open capsule, sprinkle in applesauce, and give by mouth.   FLUoxetine  20 MG capsule Commonly known as: PROZAC  Take 20 mg by mouth daily. What changed: Another medication with the same name was removed. Continue taking this medication, and follow the directions you see here.   fluticasone 50 MCG/ACT nasal spray Commonly known as: FLONASE Place 2 sprays into both nostrils daily.   glycopyrrolate 1 MG tablet Commonly known as: ROBINUL Take 1 mg by mouth 3 (three) times daily.   hydrALAZINE  50 MG tablet Commonly known as: APRESOLINE  Take 1.5 tablets by mouth every 8 (eight) hours. What changed: Another medication with the same name was removed. Continue taking this medication, and follow the directions you see here.   ipratropium-albuterol 0.5-2.5 (3) MG/3ML Soln Commonly known as: DUONEB Take 3 mLs by nebulization every 6 (six) hours as needed (Wheezing).   lisinopril 40 MG tablet Commonly known as: ZESTRIL Take 40 mg by mouth daily.   magnesium oxide 400 (240 Mg) MG tablet Commonly known as: MAG-OX Take  400 mg by mouth daily.   melatonin 5 MG Tabs Take 5 mg by mouth at bedtime.   methadone  5 MG tablet Commonly known as: DOLOPHINE  Take 5 mg by mouth 2 (two) times daily as needed.   naloxone 4 MG/0.1ML Liqd nasal spray kit Commonly known as: NARCAN Place 1 spray into the nose as needed.   nitroGLYCERIN  0.4 MG SL tablet Commonly known as: NITROSTAT  Place 0.4 mg under the tongue every 5 (five) minutes as needed for chest pain.   phosphorus 155-852-130 MG tablet Commonly known as: K PHOS  NEUTRAL Take 1 tablet (250 mg total) by mouth 2 (two) times daily for 5 days.   polyethylene glycol 17 g packet Commonly known as: MIRALAX  / GLYCOLAX  Take 17 g by mouth daily.   scopolamine 1 MG/3DAYS Commonly known as: TRANSDERM-SCOP Place 1 patch onto the skin every 3 (three) days.   senna 8.6 MG Tabs tablet Commonly known as: SENOKOT Take 2 tablets by mouth at bedtime.   Thera-M Tabs Take 1 tablet by  mouth daily.   Theratears 0.25 % Soln Generic drug: Carboxymethylcellulose Sodium Place 1 drop into both eyes at bedtime.   thiamine 100 MG tablet Commonly known as: VITAMIN B1 Take 100 mg by mouth daily.   tiZANidine  2 MG tablet Commonly known as: ZANAFLEX  Take 2 mg by mouth 3 (three) times daily.   Vitamin D (Ergocalciferol) 1.25 MG (50000 UNIT) Caps capsule Commonly known as: DRISDOL Take 50,000 Units by mouth every 30 (thirty) days. (First of the month)   Zinc Oxide 12.8 % ointment Commonly known as: TRIPLE PASTE Apply 1 Application topically as needed for irritation. Apply to buttocks every shift.        Follow-up Information     Donnalee Gab Follow up.   Specialty: Family Medicine Why: Hospital follow up Contact information: [address] [place] [address] [phone]                Discharge Exam: Cleavon Curls Weights   03/27/24 1247  Weight: 54.9 kg   GEN: NAD SKIN: Warm and dry EYES: No pallor or icterus ENT: MMM CV: RRR PULM:  CTA B ABD: soft, ND, NT, +BS CNS: Alert, nonverbal, does not follow commands and therefore exam is limited.  Appears to move upper extremities spontaneously but did not see him move his lower extremities. EXT: No edema or tenderness   Condition at discharge: stable  The results of significant diagnostics from this hospitalization (including imaging, microbiology, ancillary and laboratory) are listed below for reference.   Imaging Studies: DG Swallowing Func-Speech Pathology Result Date: 03/30/2024 CLINICAL DATA:  Dysphagia. Cough/GE reflux disease/other secondary diagnosis EXAM: MODIFIED BARIUM SWALLOW TECHNIQUE: Different consistencies of barium were administered orally to the patient by the Speech Pathologist. Imaging of the pharynx was performed in the lateral projection. Radiologist, not in attendance for the exam. Different consistencies of barium were administered orally to the patient by the Speech Pathologist. Imaging of the pharynx was performed in the lateral projection. The radiologist was present in the fluoroscopy room for this study, providing personal supervision. FLUOROSCOPY TIME:  Radiation Exposure Index (as provided by the fluoroscopic device): 3 minutes 48 seconds 19.70 mGy COMPARISON:  None Available. FINDINGS: Modified barium swallow was performed by the speech pathologist. Radiologist was not involved with this exam. Please refer to the Speech Pathology report for results and recommendations. IMPRESSION: Please refer to the Speech Pathologists report for complete details and recommendations. Electronically Signed   By: Bettylou Brunner M.D.   On: 03/30/2024 11:16   CT VENOGRAM HEAD Result Date: 03/28/2024 CLINICAL DATA:  Altered mental status EXAM: CT VENOGRAM HEAD TECHNIQUE: Venographic phase images of the brain were obtained following the administration of intravenous contrast. Multiplanar reformats and maximum intensity projections were generated. RADIATION DOSE REDUCTION: This  exam was performed according to the departmental dose-optimization program which includes automated exposure control, adjustment of the mA and/or kV according to patient size and/or use of iterative reconstruction technique. CONTRAST:  75mL OMNIPAQUE  IOHEXOL  350 MG/ML SOLN COMPARISON:  None Available. FINDINGS: Brain: There is no mass, hemorrhage or extra-axial collection. There is generalized atrophy without lobar predilection. Hypodensity of the white matter is most commonly associated with chronic microvascular disease. Vascular: No hyperdense vessel or unexpected vascular calcification. Superior sagittal sinus: Normal. Straight sinus: Normal. Inferior sagittal sinus, vein of Galen and internal cerebral veins: Normal. Transverse sinuses: Normal. Sigmoid sinuses: Normal. Visualized jugular veins: Normal. Skull: The visualized skull base, calvarium and extracranial soft tissues are normal. Sinuses/Orbits: No  fluid levels or advanced mucosal thickening of the visualized paranasal sinuses. No mastoid or middle ear effusion. Normal orbits. Other: None. IMPRESSION: 1. No evidence of dural venous sinus thrombosis. 2. Generalized atrophy and findings of chronic microvascular disease. Electronically Signed   By: Juanetta Nordmann M.D.   On: 03/28/2024 21:19   MR BRAIN WO CONTRAST Result Date: 03/27/2024 CLINICAL DATA:  Neuro deficit, acute, stroke suspected. EXAM: MRI HEAD WITHOUT CONTRAST TECHNIQUE: Multiplanar, multiecho pulse sequences of the brain and surrounding structures were obtained without intravenous contrast. COMPARISON:  None Available. FINDINGS: Brain: No acute infarction, acute hemorrhage, hydrocephalus, extra-axial collection or mass lesion. Moderate T2/FLAIR hyperintensities white matter, compatible with chronic microvascular ischemic disease. Evidence of prior hemorrhage in the right basal ganglia with associated hemosiderin encephalomalacia. Remote bilateral basal ganglia lacunar infarcts. Vascular:  Major arterial flow voids are maintained at the skull base. T2 hyperintensity within the right transverse and sigmoid sinus Skull and upper cervical spine: Normal marrow signal. Sinuses/Orbits: Clear sinuses.  No acute orbital findings. Other: Partially imaged edema involving the right parotid gland and surrounding soft tissues. IMPRESSION: 1. Partially imaged edema involving the right parotid gland and surrounding soft tissues, concerning for parotiditis and cellulitis. Less likely malignancy. Recommend correlation with physical exam and consider CT of the neck with contrast to better evaluate. 2. T2 hyperintensity in the right transverse and sigmoid sinus could represent slow flow or thrombus. Recommend postcontrast MRI or CTV to further evaluate. 3. Otherwise, no evidence of acute intracranial abnormality. 4. Evidence of prior hemorrhage in the right basal ganglia and overlying white matter. Electronically Signed   By: Stevenson Elbe M.D.   On: 03/27/2024 23:22   DG Abd 1 View Result Date: 03/27/2024 CLINICAL DATA:  Imaging to screen for metal prior to MRI. EXAM: ABDOMEN - 1 VIEW COMPARISON:  None Available. FINDINGS: The bowel gas pattern is normal. No radio-opaque calculi or other significant radiographic abnormality are seen. Degenerative changes are seen involving the bilateral hips and visualized portion of the lumbar spine. IMPRESSION: No evidence of a metallic foreign body within the abdomen or pelvis. Electronically Signed   By: Virgle Grime M.D.   On: 03/27/2024 19:19   DG Chest Port 1 View Result Date: 03/27/2024 CLINICAL DATA:  [id number] Altered mental status [id number] EXAM: PORTABLE CHEST - 1 VIEW COMPARISON:  10/09/2022 FINDINGS: Lungs clear. Heart size and mediastinal contours are within normal limits. Atheromatous ectatic thoracic aorta. No effusion. Visualized bones unremarkable. IMPRESSION: No acute cardiopulmonary disease. Electronically Signed   By: Nicoletta Barrier M.D.   On: 03/27/2024  16:41   CT Head Wo Contrast Result Date: 03/27/2024 CLINICAL DATA:  Altered mental status. EXAM: CT HEAD WITHOUT CONTRAST TECHNIQUE: Contiguous axial images were obtained from the base of the skull through the vertex without intravenous contrast. RADIATION DOSE REDUCTION: This exam was performed according to the departmental dose-optimization program which includes automated exposure control, adjustment of the mA and/or kV according to patient size and/or use of iterative reconstruction technique. COMPARISON:  CT head without contrast 02/28/2022 FINDINGS: Brain: Remote lacunar infarcts of the right caudate head are similar the prior study. Remote lacunar infarcts of the corona radiata are stable bilaterally. No acute infarct, hemorrhage, or mass lesion is present. Moderate atrophy and white matter changes are similar the prior exam. The ventricles are proportionate to the degree of atrophy. Remote ischemic changes are present in the right external capsule. The brainstem and cerebellum are within normal limits. Enlarged relatively empty sella is similar the  prior study. Midline structures are otherwise within normal limits. Vascular: Atherosclerotic calcifications are present within the cavernous internal carotid arteries bilaterally. No hyperdense vessel is present. Skull: Asymmetric swelling is present over the right side of the face. No underlying etiology is evident. Calvarium is intact. Sinuses/Orbits: The paranasal sinuses and mastoid air cells are clear. Bilateral lens replacements are noted. Globes and orbits are otherwise unremarkable. IMPRESSION: 1. No acute intracranial abnormality or significant interval change. 2. Stable atrophy and white matter disease. This likely reflects the sequela of chronic microvascular ischemia. 3. Stable remote lacunar infarcts of the right caudate head and corona radiata bilaterally. 4. Asymmetric swelling over the right side of the face. No underlying etiology is evident.  Electronically Signed   By: Audree Leas M.D.   On: 03/27/2024 13:40    Microbiology: Results for orders placed or performed during the hospital encounter of 03/27/24  Resp panel by RT-PCR (RSV, Flu A&B, Covid) Anterior Nasal Swab     Status: None   Collection Time: 03/27/24 12:51 PM   Specimen: Anterior Nasal Swab  Result Value Ref Range Status   SARS Coronavirus 2 by RT PCR NEGATIVE NEGATIVE Final    Comment: (NOTE) SARS-CoV-2 target nucleic acids are NOT DETECTED.  The SARS-CoV-2 RNA is generally detectable in upper respiratory specimens during the acute phase of infection. The lowest concentration of SARS-CoV-2 viral copies this assay can detect is 138 copies/mL. A negative result does not preclude SARS-Cov-2 infection and should not be used as the sole basis for treatment or other patient management decisions. A negative result may occur with  improper specimen collection/handling, submission of specimen other than nasopharyngeal swab, presence of viral mutation(s) within the areas targeted by this assay, and inadequate number of viral copies(<138 copies/mL). A negative result must be combined with clinical observations, patient history, and epidemiological information. The expected result is Negative.  Fact Sheet for Patients:  BloggerCourse.com  Fact Sheet for Healthcare Providers:  SeriousBroker.it  This test is no t yet approved or cleared by the [place]  FDA and  has been authorized for detection and/or diagnosis of SARS-CoV-2 by FDA under an Emergency Use Authorization (EUA). This EUA will remain  in effect (meaning this test can be used) for the duration of the COVID-19 declaration under Section 564(b)(1) of the Act, 21 U.S.C.section 360bbb-3(b)(1), unless the authorization is terminated  or revoked sooner.       Influenza A by PCR NEGATIVE NEGATIVE Final   Influenza B by PCR NEGATIVE NEGATIVE Final     Comment: (NOTE) The Xpert Xpress SARS-CoV-2/FLU/RSV plus assay is intended as an aid in the diagnosis of influenza from Nasopharyngeal swab specimens and should not be used as a sole basis for treatment. Nasal washings and aspirates are unacceptable for Xpert Xpress SARS-CoV-2/FLU/RSV testing.  Fact Sheet for Patients: BloggerCourse.com  Fact Sheet for Healthcare Providers: SeriousBroker.it  This test is not yet approved or cleared by the [place]  FDA and has been authorized for detection and/or diagnosis of SARS-CoV-2 by FDA under an Emergency Use Authorization (EUA). This EUA will remain in effect (meaning this test can be used) for the duration of the COVID-19 declaration under Section 564(b)(1) of the Act, 21 U.S.C. section 360bbb-3(b)(1), unless the authorization is terminated or revoked.     Resp Syncytial Virus by PCR NEGATIVE NEGATIVE Final    Comment: (NOTE) Fact Sheet for Patients: BloggerCourse.com  Fact Sheet for Healthcare Providers: SeriousBroker.it  This test is not yet approved or cleared by the  [place]  FDA and has been authorized for detection and/or diagnosis of SARS-CoV-2 by FDA under an Emergency Use Authorization (EUA). This EUA will remain in effect (meaning this test can be used) for the duration of the COVID-19 declaration under Section 564(b)(1) of the Act, 21 U.S.C. section 360bbb-3(b)(1), unless the authorization is terminated or revoked.  Performed at [facility], [address] [place], [place] [address]   Blood culture (single)     Status: None (Preliminary result)   Collection Time: 03/27/24 12:56 PM   Specimen: BLOOD  Result Value Ref Range Status   Specimen Description BLOOD RIGHT ANTECUBITAL  Final   Special Requests   Final    BLOOD Blood Culture results may not be optimal due to an inadequate volume of blood  received in culture bottles   Culture   Final    NO GROWTH 4 DAYS Performed at [facility] [place] [facility], [address] [place], [place] [address]    Report Status PENDING  Incomplete  Culture, blood (single) w Reflex to ID Panel     Status: None (Preliminary result)   Collection Time: 03/27/24  3:49 PM   Specimen: BLOOD  Result Value Ref Range Status   Specimen Description BLOOD LEFT ANTECUBITAL  Final   Special Requests   Final    BOTTLES DRAWN AEROBIC AND ANAEROBIC Blood Culture results may not be optimal due to an inadequate volume of blood received in culture bottles   Culture   Final    NO GROWTH 4 DAYS Performed at [facility], [address] [place], [place] [address]    Report Status PENDING  Incomplete    Labs: CBC: Recent Labs  Lab 03/27/24 1251 03/28/24 0509 03/31/24 0503  WBC 16.0* 13.4* 8.4  NEUTROABS 11.6*  --   --   HGB 12.2* 11.7* 12.6*  HCT 38.5* 36.4* 37.7*  MCV 95.5 93.8 88.9  PLT 352 342 457*   Basic Metabolic Panel: Recent Labs  Lab 03/27/24 1251 03/28/24 0509 03/30/24 0354 03/31/24 0503  NA 144 146* 146* 141  K 3.9 3.7 3.4* 3.4*  CL 110 110 114* 111  CO2 26 25 25 23   GLUCOSE 77 73 97 109*  BUN 43* 37* 25* 19  CREATININE 1.40* 1.26* 0.94 0.92  CALCIUM  8.7* 8.7* 8.5* 8.6*  MG  --   --  2.3  --   PHOS  --   --  1.7* 2.1*   Liver Function Tests: Recent Labs  Lab 03/27/24 1251 03/31/24 0503  AST 25  --   ALT 27  --   ALKPHOS 45  --   BILITOT 0.6  --   PROT 6.7  --   ALBUMIN 2.4* 2.5*   CBG: No results for input(s): "GLUCAP" in the last 168 hours.  Discharge time spent: greater than 30 minutes.  Signed: Sheril Dines, MD [facility] 03/31/2024

## 2024-03-31 NOTE — Progress Notes (Signed)
 Patient report given to LPN lamanda.All questions are answered via phone. No any questions or concerns at this time.

## 2024-03-31 NOTE — [place] FL2 (Signed)
 [place]  MEDICAID FL2 LEVEL OF CARE FORM     IDENTIFICATION  Patient Name: Mark Shepard Birthdate: 1956/09/07 Sex: male Admission Date (Current Location): 03/27/2024  [place] and [place] Number:  Chiropodist and Address:  [facility], [address] [place], [place] [address]      Provider Number: [id number]  Attending Physician Name and Address:  Sheril Dines, MD  Relative Name and Phone Number:       Current Level of Care: Hospital Recommended Level of Care: Nursing Facility Prior Approval Number:    Date Approved/Denied:   PASRR Number: [id number]  Discharge Plan: Domiciliary (Rest home)    Current Diagnoses: Patient Active Problem List   Diagnosis Date Noted   Parotiditis, right 03/31/2024   Protein-calorie malnutrition, severe 03/30/2024   AMS (altered mental status) 03/28/2024   Altered mental status 03/27/2024   Leukocytosis 03/27/2024   History of depression 03/27/2024   Acute ischemic stroke (HCC) 06/11/2021   Encounter for medical examination to establish care 11/23/2020   Lumbar radiculopathy 11/23/2020   Chronic midline low back pain without sciatica 10/03/2019   Cervical myelopathy (HCC) 05/09/2019   Primary osteoarthritis involving multiple joints 06/03/2017   Alcohol use disorder, severe, in early remission (HCC) 05/29/2016   CKD stage 3a, GFR 45-59 ml/min (HCC) 06/28/2015   Cardiomyopathy (HCC) 05/19/2014   ICH (intracerebral hemorrhage) (HCC) 11/07/2013   Hypertension 07/06/2013    Orientation RESPIRATION BLADDER Height & Weight     Self    Incontinent Weight: 54.9 kg Height:  5\' 6"  (167.6 cm)  BEHAVIORAL SYMPTOMS/MOOD NEUROLOGICAL BOWEL NUTRITION STATUS      Incontinent    AMBULATORY STATUS COMMUNICATION OF NEEDS Skin   Extensive Assist Non-Verbally                         Personal Care Assistance Level of Assistance  Bathing, Feeding, Dressing Bathing Assistance: Limited assistance Feeding  assistance: Limited assistance Dressing Assistance: Limited assistance     Functional Limitations Info             SPECIAL CARE FACTORS FREQUENCY                       Contractures      Additional Factors Info  Code Status Code Status Info: DNR             Current Medications (03/31/2024):  This is the current hospital active medication list Current Facility-Administered Medications  Medication Dose Route Frequency Provider Last Rate Last Admin   acetaminophen  (TYLENOL ) tablet 650 mg  650 mg Oral Q6H PRN Cox, Amy N, DO       Or   acetaminophen  (TYLENOL ) suppository 650 mg  650 mg Rectal Q6H PRN Cox, Amy N, DO       amLODipine  (NORVASC ) tablet 10 mg  10 mg Oral Daily Cox, Amy N, DO   10 mg at 03/31/24 1014   amoxicillin -clavulanate (AUGMENTIN ) 875-125 MG per tablet 1 tablet  1 tablet Oral Q12H Sheril Dines, MD   1 tablet at 03/31/24 1014   aspirin  EC tablet 81 mg  81 mg Oral Daily Cox, Amy N, DO   81 mg at 03/31/24 1014   atorvastatin  (LIPITOR) tablet 40 mg  40 mg Oral QPM Cox, Amy N, DO   40 mg at 03/30/24 2203   carvedilol  (COREG ) tablet 6.25 mg  6.25 mg Oral BID WC Cox, Amy N, DO   6.25  mg at 03/31/24 0800   feeding supplement (NEPRO CARB STEADY) liquid 237 mL  237 mL Oral TID BM Sira, Zackery, MD   237 mL at 03/30/24 1451   FLUoxetine  (PROZAC ) capsule 20 mg  20 mg Oral Daily Cox, Amy N, DO   20 mg at 03/31/24 1014   heparin  injection 5,000 Units  5,000 Units Subcutaneous Q8H Cox, Amy N, DO   5,000 Units at 03/31/24 1610   hydrALAZINE  (APRESOLINE ) injection 5 mg  5 mg Intravenous Q6H PRN Cox, Amy N, DO       multivitamin with minerals tablet 1 tablet  1 tablet Oral Daily Zhang, Dekui, MD   1 tablet at 03/31/24 1014   nitroGLYCERIN  (NITROSTAT ) SL tablet 0.4 mg  0.4 mg Sublingual Q5 min PRN Cox, Amy N, DO       ondansetron  (ZOFRAN ) tablet 4 mg  4 mg Oral Q6H PRN Cox, Amy N, DO       Or   ondansetron  (ZOFRAN ) injection 4 mg  4 mg Intravenous Q6H PRN Cox, Amy N, DO        phosphorus (K PHOS  NEUTRAL) tablet 500 mg  500 mg Oral TID Sheril Dines, MD   500 mg at 03/31/24 1223   polyethylene glycol (MIRALAX  / GLYCOLAX ) packet 17 g  17 g Oral BID PRN Cox, Amy N, DO       senna (SENOKOT) tablet 17.2 mg  2 tablet Oral QHS Cox, Amy N, DO   17.2 mg at 03/30/24 2203   senna-docusate (Senokot-S) tablet 1 tablet  1 tablet Oral QHS PRN Cox, Amy N, DO         Discharge Medications: Please see discharge summary for a list of discharge medications.  Relevant Imaging Results:  Relevant Lab Results:   Additional Information NKA  Elsie Halo, RN

## 2024-03-31 NOTE — TOC Transition Note (Signed)
 Transition of Care [name]) - Discharge Note   Patient Details  Name: Mark Shepard MRN: [id number] Date of Birth: 26-Dec-1955  Transition of Care [facility]) CM/SW Contact:  Elsie Halo, RN Phone Number: 03/31/2024, 12:43 PM   Clinical Narrative:    Patient is medically clear for dc to [facility]. TOC left voicemail message with the patient's cousin, Fredrik Jensen [phone], to advise of the dc. Transportation arranged with [facility]. No other TOC needs identified.   Final next level of care: Long Term Acute Care (LTAC) Barriers to Discharge: Continued Medical Work up   Patient Goals and CMS Choice            Discharge Placement              Patient chooses bed at: [facility] [place] Patient to be transferred to facility by: [facility] Name of family member notified: Fredrik Jensen Patient and family notified of of transfer: 03/31/24  Discharge Plan and Services Additional resources added to the After Visit Summary for     Discharge Planning Services: CM Consult                                 Social Drivers of Health (SDOH) Interventions SDOH Screenings   Food Insecurity: No Food Insecurity (03/28/2024)  Housing: Low Risk  (03/28/2024)  Transportation Needs: No Transportation Needs (03/28/2024)  Utilities: Not At Risk (03/28/2024)  Financial Resource Strain: Medium Risk (12/12/2020)   Received from [facility], [facility]  Social Connections: Socially Isolated (03/28/2024)  Tobacco Use: Medium Risk (03/27/2024)  Health Literacy: Low Risk  (12/12/2020)   Received from [facility], [facility]     Readmission Risk Interventions     No data to display

## 2024-03-31 NOTE — Care Management Important Message (Signed)
 Important Message  Patient Details  Name: Mark Shepard MRN: [id number] Date of Birth: 09/09/1956   Important Message Given:  Yes - Medicare IM     Milla Wahlberg W, CMA 03/31/2024, 10:04 AM

## 2024-04-01 LAB — CULTURE, BLOOD (SINGLE)
Culture: NO GROWTH
Culture: NO GROWTH

## 2024-09-02 ENCOUNTER — Emergency Department

## 2024-09-02 ENCOUNTER — Emergency Department
Admission: EM | Admit: 2024-09-02 | Discharge: 2024-09-03 | Disposition: A | Discharge status: Home / Self Care | Attending: Emergency Medicine | Admitting: Emergency Medicine

## 2024-09-02 ENCOUNTER — Other Ambulatory Visit: Payer: Self-pay

## 2024-09-02 DIAGNOSIS — G40909 Epilepsy, unspecified, not intractable, without status epilepticus: Secondary | ICD-10-CM | POA: Diagnosis not present

## 2024-09-02 DIAGNOSIS — W06XXXA Fall from bed, initial encounter: Secondary | ICD-10-CM | POA: Diagnosis not present

## 2024-09-02 DIAGNOSIS — G40919 Epilepsy, unspecified, intractable, without status epilepticus: Secondary | ICD-10-CM

## 2024-09-02 DIAGNOSIS — R569 Unspecified convulsions: Secondary | ICD-10-CM

## 2024-09-02 LAB — COMPREHENSIVE METABOLIC PANEL WITH GFR
ALT: 17 U/L (ref 0–44)
AST: 28 U/L (ref 15–41)
Albumin: 4.1 g/dL (ref 3.5–5.0)
Alkaline Phosphatase: 52 U/L (ref 38–126)
Anion gap: 11 (ref 5–15)
BUN: 25 mg/dL — ABNORMAL HIGH (ref 8–23)
CO2: 24 mmol/L (ref 22–32)
Calcium: 9.8 mg/dL (ref 8.9–10.3)
Chloride: 104 mmol/L (ref 98–111)
Creatinine, Ser: 1.2 mg/dL (ref 0.61–1.24)
GFR, Estimated: >60 mL/min (ref >60)
Glucose, Bld: 104 mg/dL — ABNORMAL HIGH (ref 70–99)
Potassium: 3.3 mmol/L — ABNORMAL LOW (ref 3.5–5.1)
Sodium: 139 mmol/L (ref 135–145)
Total Bilirubin: 0.7 mg/dL (ref 0.0–1.2)
Total Protein: 8.9 g/dL — ABNORMAL HIGH (ref 6.5–8.1)

## 2024-09-02 LAB — URINALYSIS, ROUTINE W REFLEX MICROSCOPIC
Bacteria, UA: NONE SEEN
Bilirubin Urine: NEGATIVE
Glucose, UA: NEGATIVE mg/dL
Hgb urine dipstick: NEGATIVE
Ketones, ur: NEGATIVE mg/dL
Leukocytes,Ua: NEGATIVE
Nitrite: NEGATIVE
Protein, ur: 100 mg/dL — AB
Specific Gravity, Urine: 1.013 (ref 1.005–1.030)
pH: 8 (ref 5.0–8.0)

## 2024-09-02 LAB — URINE DRUG SCREEN, QUALITATIVE (ARMC ONLY)
Amphetamines, Ur Screen: NOT DETECTED
Barbiturates, Ur Screen: NOT DETECTED
Benzodiazepine, Ur Scrn: NOT DETECTED
Cannabinoid 50 Ng, Ur ~~LOC~~: NOT DETECTED
Cocaine Metabolite,Ur ~~LOC~~: NOT DETECTED
MDMA (Ecstasy)Ur Screen: NOT DETECTED
Methadone Scn, Ur: NOT DETECTED
Opiate, Ur Screen: NOT DETECTED
Phencyclidine (PCP) Ur S: NOT DETECTED
Tricyclic, Ur Screen: NOT DETECTED

## 2024-09-02 LAB — RESP PANEL BY RT-PCR (RSV, FLU A&B, COVID)  RVPGX2
Influenza A by PCR: NEGATIVE
Influenza B by PCR: NEGATIVE
Resp Syncytial Virus by PCR: NEGATIVE
SARS Coronavirus 2 by RT PCR: NEGATIVE

## 2024-09-02 LAB — CBC WITH DIFFERENTIAL/PLATELET
Abs Immature Granulocytes: 0.04 K/uL (ref 0.00–0.07)
Basophils Absolute: 0 K/uL (ref 0.0–0.1)
Basophils Relative: 0 %
Eosinophils Absolute: 0 K/uL (ref 0.0–0.5)
Eosinophils Relative: 0 %
HCT: 45.2 % (ref 39.0–52.0)
Hemoglobin: 14.7 g/dL (ref 13.0–17.0)
Immature Granulocytes: 0 %
Lymphocytes Relative: 19 %
Lymphs Abs: 2.4 K/uL (ref 0.7–4.0)
MCH: 29.1 pg (ref 26.0–34.0)
MCHC: 32.5 g/dL (ref 30.0–36.0)
MCV: 89.5 fL (ref 80.0–100.0)
Monocytes Absolute: 1.1 K/uL — ABNORMAL HIGH (ref 0.1–1.0)
Monocytes Relative: 8 %
Neutro Abs: 9.6 K/uL — ABNORMAL HIGH (ref 1.7–7.7)
Neutrophils Relative %: 73 %
Platelets: 329 K/uL (ref 150–400)
RBC: 5.05 MIL/uL (ref 4.22–5.81)
RDW: 13.4 % (ref 11.5–15.5)
WBC: 13.2 K/uL — ABNORMAL HIGH (ref 4.0–10.5)
nRBC: 0 % (ref 0.0–0.2)

## 2024-09-02 LAB — MAGNESIUM: Magnesium: 2.2 mg/dL (ref 1.7–2.4)

## 2024-09-02 LAB — LACTIC ACID, PLASMA
Lactic Acid, Venous: 1.3 mmol/L (ref 0.5–1.9)
Lactic Acid, Venous: 3.3 mmol/L (ref 0.5–1.9)

## 2024-09-02 LAB — VALPROIC ACID LEVEL: Valproic Acid Lvl: 24 ug/mL — ABNORMAL LOW (ref 50–100)

## 2024-09-02 LAB — TROPONIN I (HIGH SENSITIVITY)
Troponin I (High Sensitivity): 20 ng/L — ABNORMAL HIGH (ref <18)
Troponin I (High Sensitivity): 20 ng/L — ABNORMAL HIGH (ref <18)

## 2024-09-02 LAB — PROCALCITONIN: Procalcitonin: <0.1 ng/mL

## 2024-09-02 MED ORDER — DIVALPROEX SODIUM 250 MG PO DR TAB
250.0000 mg | DELAYED_RELEASE_TABLET | Freq: Once | ORAL | Status: AC
  Administered 2024-09-02: 250 mg via ORAL
  Filled 2024-09-02: qty 1

## 2024-09-02 MED ORDER — LACTATED RINGERS IV BOLUS
1000.0000 mL | Freq: Once | INTRAVENOUS | Status: AC
Start: 2024-09-02 — End: 2024-09-02
  Administered 2024-09-02: 1000 mL via INTRAVENOUS

## 2024-09-02 NOTE — ED Notes (Signed)
 FNP from [facility] called for update.

## 2024-09-02 NOTE — ED Notes (Signed)
MD made aware of pt BP

## 2024-09-02 NOTE — ED Triage Notes (Signed)
 Pt BIB [facility]. Pt has a hx of CVA , HF, conversion disorder with sz, CAD, CKD, and is non-verbal with aphasia. Pt from [facility] had a fall last night and then at 0700 this AM pt had a sz this AM. Pt is alert and able to shake head to yes/no questions and reports pain to the L leg.

## 2024-09-02 NOTE — ED Provider Notes (Signed)
 [facility] Provider Note    Event Date/Time   First MD Initiated Contact with Patient 09/02/24 1237     (approximate)   History   Seizures   HPI  Mark Shepard is a 67 y.o. male who presents to the ED for evaluation of Seizures   Review of medical DC summary from April where he was admitted for altered mentation.  History of seizure disorder, multiple strokes with right-sided weakness and expressive aphasia.  Cocaine abuse, nonischemic cardiomyopathy.  Resides at a local SNF, nonverbal at baseline and communicate by shaking/nodding his head, squeezing of hands.  Patient presents to the ED due to reported seizure this morning.  Per EMS reports patient fell out of bed around midnight, they helped him back into bed.  Reportedly had a seizure around 7 AM that lasted a few moments and did not require abortive measures.  He has been encephalopathic since this at 7 AM seizures so was brought to the ED due to abnormal mentation.  EMS reports no seizures with them  History is limited as patient is nonverbal  Physical Exam   Triage Vital Signs: ED Triage Vitals  Encounter Vitals Group     BP      Girls Systolic BP Percentile      Girls Diastolic BP Percentile      Boys Systolic BP Percentile      Boys Diastolic BP Percentile      Pulse      Resp      Temp      Temp src      SpO2      Weight      Height      Head Circumference      [facility]      Pain Score      Pain Loc      Pain Education      Exclude from Growth Chart     Most recent vital signs: Vitals:   09/02/24 1242 09/02/24 1446  BP: (!) 176/135   Pulse: (!) 109   Resp: 20   Temp: 99.3 F (37.4 C) 100.3 F (37.9 C)  SpO2: 98%     General: Seemingly asleep versus postictal, poorly responsive initially, no distress.  On reassessments he seems to be back to reported baseline CV:  Good peripheral perfusion.  Resp:  Normal effort.  Abd:  No distention.  MSK:  No deformity noted.  No  signs of trauma or decubitus ulcers or cellulitis Neuro:  No focal deficits appreciated. Other:     ED Results / Procedures / Treatments   Labs (all labs ordered are listed, but only abnormal results are displayed) Labs Reviewed  CBC WITH DIFFERENTIAL/PLATELET - Abnormal; Notable for the following components:      Result Value   WBC 13.2 (*)    Neutro Abs 9.6 (*)    Monocytes Absolute 1.1 (*)    All other components within normal limits  RESP PANEL BY RT-PCR (RSV, FLU A&B, COVID)  [id number]  CULTURE, BLOOD (ROUTINE X 2)  CULTURE, BLOOD (ROUTINE X 2)  URINALYSIS, ROUTINE W REFLEX MICROSCOPIC  MAGNESIUM  COMPREHENSIVE METABOLIC PANEL WITH GFR  URINE DRUG SCREEN, QUALITATIVE ([facility] ONLY)  LACTIC ACID, PLASMA  LACTIC ACID, PLASMA  PROCALCITONIN  VALPROIC ACID  LEVEL  TROPONIN I (HIGH SENSITIVITY)  TROPONIN I (HIGH SENSITIVITY)    EKG   RADIOLOGY CT head interpreted by me without evidence of acute intracranial pathology CXR interpreted by me  without evidence of acute cardiopulmonary pathology.   Plain film of the left hip and femur interpreted by me without evidence of fracture   Official radiology report(s): CT HEAD WO CONTRAST ( ) Result Date: 09/02/2024 CLINICAL DATA:  Altered level of consciousness, fell, seizures EXAM: CT HEAD WITHOUT CONTRAST TECHNIQUE: Contiguous axial images were obtained from the base of the skull through the vertex without intravenous contrast. RADIATION DOSE REDUCTION: This exam was performed according to the departmental dose-optimization program which includes automated exposure control, adjustment of the mA and/or kV according to patient size and/or use of iterative reconstruction technique. COMPARISON:  03/28/2024 FINDINGS: The examination is limited due to patient motion throughout the study. Brain: Chronic small vessel ischemic changes within the periventricular white matter, stable. No evidence of acute infarct or hemorrhage. Lateral ventricles and  midline structures are stable. No acute extra-axial fluid collections. No mass effect. Vascular: No hyperdense vessel or unexpected calcification. Skull: Normal. Negative for fracture or focal lesion. Sinuses/Orbits: No acute finding. Other: None. IMPRESSION: 1. Limited exam due to patient motion. 2. No acute intracranial process. 3. Stable chronic small vessel ischemic changes. Electronically Signed   By: Ozell Daring M.D.   On: 09/02/2024 14:20   DG Chest Portable 1 View Result Date: 09/02/2024 CLINICAL DATA:  Clemens, seizures, altered level of consciousness EXAM: PORTABLE CHEST 1 VIEW COMPARISON:  03/27/2024 FINDINGS: Two frontal views of the chest demonstrate a stable cardiac silhouette. No acute airspace disease, effusion, or pneumothorax. No acute displaced fractures. Prior healed left fourth and fifth rib fractures. IMPRESSION: 1. No acute intrathoracic process. Electronically Signed   By: Ozell Daring M.D.   On: 09/02/2024 14:14   DG HIP UNILAT W OR W/O PELVIS 2-3 VIEWS LEFT Result Date: 09/02/2024 CLINICAL DATA:  Clemens yesterday EXAM: DG HIP (WITH OR WITHOUT PELVIS) 2-3V LEFT COMPARISON:  None Available. FINDINGS: Frontal view of the pelvis as well as frontal and frogleg lateral views of the left hip are obtained. There is severe right hip osteoarthritis with complete loss of joint space, bony remodeling of the femoral head and acetabulum, and superior subluxation of the femoral head within the acetabular joint space. The left hip is unremarkable with no fracture, subluxation, or dislocation. There is mild to moderate left hip osteoarthritis. The remainder of the bony pelvis appears unremarkable. IMPRESSION: 1. No acute displaced fracture. 2. Mild to moderate left hip osteoarthritis. 3. Severe right hip osteoarthritis, with bony remodeling and superior subluxation of the femoral head within the right acetabular fossa as above. Electronically Signed   By: Ozell Daring M.D.   On: 09/02/2024 14:12    CT Cervical Spine Wo Contrast Result Date: 09/02/2024 CLINICAL DATA:  Status post seizure with fall. EXAM: CT CERVICAL SPINE WITHOUT CONTRAST TECHNIQUE: Multidetector CT imaging of the cervical spine was performed without intravenous contrast. Multiplanar CT image reconstructions were also generated. RADIATION DOSE REDUCTION: This exam was performed according to the departmental dose-optimization program which includes automated exposure control, adjustment of the mA and/or kV according to patient size and/or use of iterative reconstruction technique. COMPARISON:  CT cervical spine 02/28/2022 FINDINGS: Technical note: Despite efforts by the technologist and patient, mild motion artifact is present on today's exam and could not be eliminated. This reduces exam sensitivity and specificity. Study was repeated. Alignment: Straightening of the usual cervical lordosis with a mild cervicothoracic scoliosis. No significant focal angulation or listhesis. Skull base and vertebrae: No evidence of acute cervical spine fracture or traumatic subluxation. Multilevel spondylosis with disc space  narrowing, uncinate spurring and facet hypertrophy. Soft tissues and spinal canal: No prevertebral fluid or swelling. No visible canal hematoma. Disc levels: Multilevel cervical spondylosis. No large disc herniation identified. Mild-to-moderate osseous foraminal narrowing at multiple levels which appears worst at C6-7. Upper chest: Clear lung apices. Aortic and great vessel atherosclerosis. Other: Bilateral carotid atherosclerosis. IMPRESSION: 1. No evidence of acute cervical spine fracture, traumatic subluxation or static signs of instability. 2. Multilevel cervical spondylosis as described. 3.  Aortic Atherosclerosis (ICD10-I70.0). Electronically Signed   By: Elsie Perone M.D.   On: 09/02/2024 14:12    PROCEDURES and INTERVENTIONS:  .Critical Care  Performed by: Claudene Rover, MD Authorized by: Claudene Rover, MD   Critical  care provider statement:    Critical care time (minutes):  30   Critical care time was exclusive of:  Separately billable procedures and treating other patients   Critical care was necessary to treat or prevent imminent or life-threatening deterioration of the following conditions:  Sepsis   Critical care was time spent personally by me on the following activities:  Development of treatment plan with patient or surrogate, discussions with consultants, evaluation of patient's response to treatment, examination of patient, ordering and review of laboratory studies, ordering and review of radiographic studies, ordering and performing treatments and interventions, pulse oximetry, re-evaluation of patient's condition and review of old charts   Medications  lactated ringers  bolus 1,000 mL (has no administration in time range)     IMPRESSION / MDM / ASSESSMENT AND PLAN / ED COURSE  I reviewed the triage vital signs and the nursing notes.  Differential diagnosis includes, but is not limited to, medication noncompliance or subtherapeutic antiepileptic, viral syndrome, sepsis, meningitis, UTI, pneumonia, decubitus ulcer, cellulitis  {Patient presents with symptoms of an acute illness or injury that is potentially life-threatening.  Patient presents after breakthrough seizure.  Returns to baseline after here in the ED.  Noted to be quite warm with a borderline low-grade temperature, tachycardic but stable without signs of shock.  Workup is just being initiated by the time of signout to oncoming physician due to delays in getting IV access.  Imaging is reassuring.  Signed out to oncoming physician with anticipation of admission for possible sepsis and breakthrough seizure.  Clinical Course as of 09/02/24 1551  Fri Sep 02, 2024  1430 Reassessed with nursing staff.  He is awake and seems to be at his baseline from what I have been told his baseline represents.  Have not yet gotten any blood work.  While I  am helping him reposition in bed, his back feels quite hot to me and gives me concern for possible fever.  Update nursing staff asked him to get a rectal temperature and help undress him so we can do a more thorough skin examination to see if we need to augment serum workup with sepsis protocols [DS]  1502 Signed out pending sepsis workup. Aphasic(shakes head yes and no), hx of seizures (dilantin), fall last night, seizures this AM [EB]    Clinical Course User Index [DS] Claudene Rover, MD [EB] Jossie Artist POUR, MD     FINAL CLINICAL IMPRESSION(S) / ED DIAGNOSES   Final diagnoses:  Seizure (HCC)     Rx / DC Orders   ED Discharge Orders     None        Note:  This document was prepared using Dragon voice recognition software and may include unintentional dictation errors.   Claudene Rover, MD 09/02/24 [phone]

## 2024-09-02 NOTE — ED Notes (Signed)
 Attempted to call [facility] for report.

## 2024-09-02 NOTE — Discharge Instructions (Addendum)
 The level of your antiepileptic medication was low on evaluation in our emergency department.  You will need to engage your neurologist and/or whoever prescribes your Depakote  who will need to recheck this valproic acid  level as well as make medication adjustments as needed

## 2024-09-04 LAB — CULTURE, BLOOD (ROUTINE X 2)

## 2024-09-07 LAB — CULTURE, BLOOD (ROUTINE X 2)
Culture  Setup Time: NO GROWTH
Culture: NO GROWTH
Report Status: NO GROWTH
Special Requests: ADEQUATE
Special Requests: ADEQUATE
